# Patient Record
Sex: Female | Born: 2000 | Race: Black or African American | Hispanic: No | Marital: Single | Smoking: Never smoker
Health system: Southern US, Community
[De-identification: ages and names within clinical notes are randomized; demographics above are authoritative.]

## PROBLEM LIST (undated history)

## (undated) DIAGNOSIS — E669 Obesity, unspecified: Secondary | ICD-10-CM

## (undated) DIAGNOSIS — L709 Acne, unspecified: Secondary | ICD-10-CM

## (undated) DIAGNOSIS — J45909 Unspecified asthma, uncomplicated: Secondary | ICD-10-CM

## (undated) DIAGNOSIS — K5900 Constipation, unspecified: Secondary | ICD-10-CM

## (undated) DIAGNOSIS — T7840XA Allergy, unspecified, initial encounter: Secondary | ICD-10-CM

## (undated) DIAGNOSIS — L83 Acanthosis nigricans: Secondary | ICD-10-CM

## (undated) DIAGNOSIS — H6123 Impacted cerumen, bilateral: Secondary | ICD-10-CM

## (undated) DIAGNOSIS — Z8759 Personal history of other complications of pregnancy, childbirth and the puerperium: Secondary | ICD-10-CM

## (undated) DIAGNOSIS — D72829 Elevated white blood cell count, unspecified: Secondary | ICD-10-CM

## (undated) HISTORY — DX: Acanthosis nigricans: L83

## (undated) HISTORY — DX: Acne, unspecified: L70.9

## (undated) HISTORY — DX: Allergy, unspecified, initial encounter: T78.40XA

## (undated) HISTORY — DX: Unspecified asthma, uncomplicated: J45.909

## (undated) HISTORY — DX: Constipation, unspecified: K59.00

## (undated) HISTORY — DX: Obesity, unspecified: E66.9

## (undated) HISTORY — DX: Personal history of other complications of pregnancy, childbirth and the puerperium: Z87.59

## (undated) HISTORY — DX: Impacted cerumen, bilateral: H61.23

## (undated) HISTORY — DX: Elevated white blood cell count, unspecified: D72.829

---
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
## 2021-08-29 ENCOUNTER — Telehealth (INDEPENDENT_AMBULATORY_CARE_PROVIDER_SITE_OTHER): Payer: BC Managed Care – PPO | Admitting: Nurse Practitioner

## 2021-08-29 DIAGNOSIS — J069 Acute upper respiratory infection, unspecified: Secondary | ICD-10-CM | POA: Diagnosis not present

## 2021-08-29 DIAGNOSIS — L0291 Cutaneous abscess, unspecified: Secondary | ICD-10-CM | POA: Diagnosis not present

## 2021-08-29 MED ORDER — BENZONATATE 100 MG PO CAPS: 100.0000 mg | ORAL_CAPSULE | Freq: Three times a day (TID) | ORAL | 0 refills | Status: DC | PRN

## 2021-08-29 MED ORDER — CEPHALEXIN 500 MG PO CAPS: 500.0000 mg | ORAL_CAPSULE | Freq: Four times a day (QID) | ORAL | 0 refills | Status: AC

## 2021-08-29 NOTE — Progress Notes (Signed)
Name: Kayla Kane   MRN: [id number]    DOB: 2000-12-22   Date:08/29/2021       Progress Note  Subjective  Chief Complaint  Chief Complaint  Patient presents with   Sore Throat    X5 days   Cough    X5 days, mostly non-productive   Mass    Vaginal area, noticed it Wednesday, dysuria    I connected with  Birdie Sons  on 08/29/21 at 11:40 AM EDT by a video enabled telemedicine application and verified that I am speaking with the correct person using two identifiers.  I discussed the limitations of evaluation and management by telemedicine and the availability of in person appointments. The patient expressed understanding and agreed to proceed with a virtual visit  Staff also discussed with the patient that there may be a patient responsible charge related to this service. Patient Location: home Provider Location: [facility] Additional Individuals present: alone  HPI  URI: She says symptoms started 5 days ago.  She took a home Covid test and it was negative.  Symptoms included sore throat, dry cough and runny nose. She denies any fever, shortness of breath or chest pain.  She says she has been taking nyquil but she says her symptoms are getting better.  Discussed OTC treatments and will send in tessalon perls for cough.    Asthma: She says her asthma has been well controlled.  She said she did have to use her albuterol inhaler last night but that is it.   Abscess: She noticed a bump on her mons pubis on Wednesday.  She says it is hard and red.  She denies any drainage.  Discussed using warm compresses.  Will send in prescription of keflex.    Patient Active Problem List   Diagnosis Date Noted   Nutritional anemia, unspecified 10/01/2016   Vitamin D deficiency 10/01/2016   Acanthosis nigricans 09/02/2015   Acne vulgaris 09/02/2015   Allergic rhinitis 09/02/2015   Childhood obesity 09/02/2015   Asthma, mild intermittent, well-controlled 09/02/2015    Social History   Tobacco Use    Smoking status: Never   Smokeless tobacco: Never  Substance Use Topics   Alcohol use: No    Alcohol/week: 0.0 standard drinks     Current Outpatient Medications:    albuterol (PROAIR HFA) 108 (90 Base) MCG/ACT inhaler, Inhale 2 puffs into the lungs every 4 (four) hours as needed., Disp: 18 g, Rfl: 0  No Known Allergies  I personally reviewed active problem list, medication list, allergies with the patient/caregiver today.  ROS  Constitutional: Negative for fever or weight change.  HEENT: positive for sore throat, nasal drainage Respiratory: Positive for cough, negative for shortness of breath.   Cardiovascular: Negative for chest pain or palpitations.  Gastrointestinal: Negative for abdominal pain, no bowel changes.  Musculoskeletal: Negative for gait problem or joint swelling.  Skin: Negative for rash., positive for abscess on mons pubis Neurological: Negative for dizziness or headache.  No other specific complaints in a complete review of systems (except as listed in HPI above).   Objective  Virtual encounter, vitals not obtained.  There is no height or weight on file to calculate BMI.  Nursing Note and Vital Signs reviewed.  Physical Exam  Awake, alert and oriented, speaking in complete sentences.   No results found for this or any previous visit (from the past 72 hour(s)).  Assessment & Plan  1. Upper respiratory tract infection, unspecified type -discussed OTC treatments for  symptoms - benzonatate (TESSALON) 100 MG capsule; Take 1 capsule (100 mg total) by mouth 3 (three) times daily as needed for cough.  Dispense: 30 capsule; Refill: 0  2. Abscess on mons pubis -warm compresses to area at least 4 times a day - cephALEXin (KEFLEX) 500 MG capsule; Take 1 capsule (500 mg total) by mouth 4 (four) times daily for 5 days.  Dispense: 20 capsule; Refill: 0   -Red flags and when to present for emergency care or RTC including fever >101.26F, chest pain, shortness of  breath, new/worsening/un-resolving symptoms, reviewed with patient at time of visit. Follow up and care instructions discussed and provided in AVS. - I discussed the assessment and treatment plan with the patient. The patient was provided an opportunity to ask questions and all were answered. The patient agreed with the plan and demonstrated an understanding of the instructions.  I provided 25 minutes of non-face-to-face time during this encounter.  Bo Merino, FNP

## 2021-11-06 ENCOUNTER — Ambulatory Visit: Payer: Self-pay | Admitting: *Deleted

## 2021-11-06 NOTE — Telephone Encounter (Signed)
C/o boil to right top side of leg near vaginal area. C/o severe pain and difficulty walking. Denies fever, more than 1 boil. C/o drainage noted and bleeding. Boil noted x a few days. No available appt. Due to pain level, encouraged UC or my chart VV or ED. Care advise given. Patient verbalized understanding of care advise and to go to [facility] or ED if symptoms worsen. My chart activation code [phone] given to patient .    Reason for Disposition  SEVERE pain (e.g., excruciating)  Answer Assessment - Initial Assessment Questions 1. APPEARANCE of BOIL: "What does the boil look like?"      Color of her skin  2. LOCATION: "Where is the boil located?"      Right side of vaginal area , top of right leg  3. NUMBER: "How many boils are there?"      na 4. SIZE: "How big is the boil?" (e.g., inches, cm; compare to size of a coin or other object)     na 5. ONSET: "When did the boil start?"     Few days ago  6. PAIN: "Is there any pain?" If Yes, ask: "How bad is the pain?"   (Scale 1-10; or mild, moderate, severe)     Yes 7 at night and 6 during the day  7. FEVER: "Do you have a fever?" If Yes, ask: "What is it, how was it measured, and when did it start?"      no 8. SOURCE: "Have you been around anyone with boils or other Staph infections?" "Have you ever had boils before?"     Na  9. OTHER SYMPTOMS: "Do you have any other symptoms?" (e.g., shaking chills, weakness, rash elsewhere on body)     No  10. PREGNANCY: "Is there any chance you are pregnant?" "When was your last menstrual period?"       no  Protocols used: Boil (Skin Abscess)-A-AH

## 2021-11-06 NOTE — Telephone Encounter (Signed)
Wrong chart in error.

## 2021-11-07 NOTE — Telephone Encounter (Signed)
There are no availabilities for today. Pt advised to go to UC

## 2022-06-15 ENCOUNTER — Telehealth: Payer: BC Managed Care – PPO | Admitting: Physician Assistant

## 2022-06-15 ENCOUNTER — Ambulatory Visit: Payer: Self-pay | Admitting: *Deleted

## 2022-06-15 DIAGNOSIS — M546 Pain in thoracic spine: Secondary | ICD-10-CM

## 2022-06-15 MED ORDER — METHOCARBAMOL 500 MG PO TABS: 500.0000 mg | ORAL_TABLET | Freq: Every day | ORAL | 0 refills | Status: DC

## 2022-06-15 MED ORDER — MELOXICAM 7.5 MG PO TABS: 7.5000 mg | ORAL_TABLET | Freq: Every day | ORAL | 0 refills | Status: DC

## 2022-06-15 NOTE — Telephone Encounter (Signed)
Summary: Intense back pain   Mother of the patient called in stating her daughter the patient is have intense low back pain. This is chronic and she has spasms here and there. She had to call in last night due to the pain. The patient has been on muscle relaxer's in the past and they have helped a lot. Can she get a prescription sent in [place] on [address]), [place]  Phone: [phone]  Fax: [phone]    Please assist patient further       2nd call placed to contact patient regarding low back pain and medication request. No answer, LVMTCB [phone].

## 2022-06-15 NOTE — Telephone Encounter (Signed)
Pt returned call but hung up while call being transferred.

## 2022-06-15 NOTE — Telephone Encounter (Signed)
Message from Roslynn Amble sent at 06/15/2022  8:58 AM EDT  Summary: Intense back pain   Mother of the patient called in stating her daughter the patient is have intense low back pain. This is chronic and she has spasms here and there. She had to call in last night due to the pain. The patient has been on muscle relaxer's in the past and they have helped a lot. Can she get a prescription sent in [place] on [address]), [place]  Phone: [phone]  Fax: [phone]    Please assist patient further           Call History   Type Contact Phone/Fax User  06/15/2022 08:54 AM EDT Phone (Incoming) [place], Jakubowicz (Mother) [phone] Roslynn Amble

## 2022-06-15 NOTE — Progress Notes (Signed)
Virtual Visit Consent   Kayla Kane, you are scheduled for a virtual visit with a [facility] provider today. Just as with appointments in the office, your consent must be obtained to participate. Your consent will be active for this visit and any virtual visit you may have with one of our providers in the next 365 days. If you have a MyChart account, a copy of this consent can be sent to you electronically.  As this is a virtual visit, video technology does not allow for your provider to perform a traditional examination. This may limit your provider's ability to fully assess your condition. If your provider identifies any concerns that need to be evaluated in person or the need to arrange testing (such as labs, EKG, etc.), we will make arrangements to do so. Although advances in technology are sophisticated, we cannot ensure that it will always work on either your end or our end. If the connection with a video visit is poor, the visit may have to be switched to a telephone visit. With either a video or telephone visit, we are not always able to ensure that we have a secure connection.  By engaging in this virtual visit, you consent to the provision of healthcare and authorize for your insurance to be billed (if applicable) for the services provided during this visit. Depending on your insurance coverage, you may receive a charge related to this service.  I need to obtain your verbal consent now. Are you willing to proceed with your visit today? Kayla Kane has provided verbal consent on 06/15/2022 for a virtual visit (video or telephone). Leeanne Rio, [place]  Date: 06/15/2022 5:43 PM  Virtual Visit via Video Note   I, Leeanne Rio, PA-C, attempted to connect with Kayla Kane; MRN [id number] on 06/15/22 via [employer] to complete a video urgent care visit. The patient was unable to successfully connect to the video platform. As such, the patient was contacted by this provider via phone  to complete the encounter.   Location: Patient: Virtual Visit Location Patient: Home Provider: Virtual Visit Location Provider: Home Office   I discussed the limitations of evaluation and management by telemedicine and the availability of in person appointments. The patient expressed understanding and agreed to proceed.    History of Present Illness: Kayla Kane is a 21 y.o. who identifies as a female who was assigned female at birth, and is being seen today for R sided back pain. Notes this weekend while eating dinner had a sharp pain in middle of R side of back that was intense and then dissipated. Since the she notes that every now and then feels a minor version of this with positional changes. Some constant soreness in the area since initial episode. Notes she recently started working out more and is working a [employer] job currently having to push and pull heavy objects. Denies any trauma or injury. Denies radiation of pain. Has been applying heat and ice and taking some Tylenol OTC.    HPI: HPI  Problems:  Patient Active Problem List   Diagnosis Date Noted   Nutritional anemia, unspecified 10/01/2016   Vitamin D deficiency 10/01/2016   Acanthosis nigricans 09/02/2015   Acne vulgaris 09/02/2015   Allergic rhinitis 09/02/2015   Childhood obesity 09/02/2015   Asthma, mild intermittent, well-controlled 09/02/2015    Allergies: No Known Allergies Medications:  Current Outpatient Medications:    meloxicam (MOBIC) 7.5 MG tablet, Take 1 tablet (7.5 mg total) by  mouth daily., Disp: 15 tablet, Rfl: 0   methocarbamol (ROBAXIN) 500 MG tablet, Take 1 tablet (500 mg total) by mouth at bedtime., Disp: 10 tablet, Rfl: 0  Observations/Objective: No labored breathing. Speech is clear and coherent with logical content.  Patient is alert and oriented at baseline.   Assessment and Plan: 1. Acute right-sided thoracic back pain - meloxicam (MOBIC) 7.5 MG tablet; Take 1 tablet (7.5 mg total) by  mouth daily.  Dispense: 15 tablet; Refill: 0 - methocarbamol (ROBAXIN) 500 MG tablet; Take 1 tablet (500 mg total) by mouth at bedtime.  Dispense: 10 tablet; Refill: 0  Atraumatic. No alarm signs or symptoms present. Rest. Heating pad in 10-15 minute intervals. Start Meloxicam 7.5 mg once daily. Ok to continue Tylenol OTC. Will give few tablets of Robaxin to use in in the evening (hour or so before bedtime) if needed for muscle spasm. Precautions for medication use discussed including no driving or operating machinery while on relaxant. Work note provided.   Follow Up Instructions: I discussed the assessment and treatment plan with the patient. The patient was provided an opportunity to ask questions and all were answered. The patient agreed with the plan and demonstrated an understanding of the instructions.  A copy of instructions were sent to the patient via MyChart unless otherwise noted below.   The patient was advised to call back or seek an in-person evaluation if the symptoms worsen or if the condition fails to improve as anticipated.  Time:  I spent 10 minutes with the patient via telehealth technology discussing the above problems/concerns.    Leeanne Rio, PA-C

## 2022-06-15 NOTE — Telephone Encounter (Signed)
  Chief Complaint: Back pain Symptoms: Mid back pain, right sided mostly."Spasms" States 3/10 when occurs "Can be worse at times" Worse with movement. States has had several times past years, had muscle relaxer "Helped." New work out and lifts in [employer].  Frequency: Onset Saturday Pertinent Negatives: Patient denies urinary symptoms,fever, numbness Disposition: '[]'$ ED /'[]'$ Urgent Care (no appt availability in office) / '[]'$ Appointment(In office/virtual)/ '[x]'$  [facility]/ '[]'$ Home Care/ '[]'$ Refused Recommended Disposition /'[]'$ [facility]/ '[]'$  Follow-up with PCP Additional Notes: [facility] Virtual secured for pt. Care advise provided, verbalizes understanding.  Reason for Disposition  [1] MODERATE back pain (e.g., interferes with normal activities) AND [2] present > 3 days  Answer Assessment - Initial Assessment Questions 1. ONSET: "When did the pain begin?"      Saturday night 2. LOCATION: "Where does it hurt?" (upper, mid or lower back)     Mid on right side 3. SEVERITY: "How bad is the pain?"  (e.g., Scale 1-10; mild, moderate, or severe)   - MILD (1-3): Doesn't interfere with normal activities.    - MODERATE (4-7): Interferes with normal activities or awakens from sleep.    - SEVERE (8-10): Excruciating pain, unable to do any normal activities.      3/10 with movement 4. PATTERN: "Is the pain constant?" (e.g., yes, no; constant, intermittent)      Certain positions 5. RADIATION: "Does the pain shoot into your legs or somewhere else?"     No 6. CAUSE:  "What do you think is causing the back pain?"      Unsure 7. BACK OVERUSE:  "Any recent lifting of heavy objects, strenuous work or exercise?"     Been working out recently, lifting 8. MEDICINES: "What have you taken so far for the pain?" (e.g., nothing, acetaminophen, NSAIDS)     Tylenol, ineffective 9. NEUROLOGIC SYMPTOMS: "Do you have any weakness, numbness, or problems with bowel/bladder control?"     No 10. OTHER  SYMPTOMS: "Do you have any other symptoms?" (e.g., fever, abdomen pain, burning with urination, blood in urine)       HAd similar pain Oct-Nov 2022  Protocols used: Back Pain-A-AH

## 2022-06-15 NOTE — Telephone Encounter (Signed)
Attempted to return call.   Left voicemail to call back to discuss back pain with a nurse.

## 2022-06-15 NOTE — Patient Instructions (Addendum)
  Kayla Kane, thank you for joining Kayla Rio, PA-C for today's virtual visit.  While this provider is not your primary care provider (PCP), if your PCP is located in our provider database this encounter information will be shared with them immediately following your visit.  Consent: (Patient) Kayla Kane provided verbal consent for this virtual visit at the beginning of the encounter.  Current Medications:  Current Outpatient Medications:    albuterol (PROAIR HFA) 108 (90 Base) MCG/ACT inhaler, Inhale 2 puffs into the lungs every 4 (four) hours as needed., Disp: 18 g, Rfl: 0   benzonatate (TESSALON) 100 MG capsule, Take 1 capsule (100 mg total) by mouth 3 (three) times daily as needed for cough., Disp: 30 capsule, Rfl: 0   Medications ordered in this encounter:  No orders of the defined types were placed in this encounter.    *If you need refills on other medications prior to your next appointment, please contact your pharmacy*  Follow-Up: Call back or seek an in-person evaluation if the symptoms worsen or if the condition fails to improve as anticipated.  Other Instructions Please keep well-hydrated. Avoid heavy lifting and overexertion. Apply heating pad to area for 10-15 minutes a few times per day. Ok to continue your OTC Tylenol as needed. Take the Meloxicam once daily with food. The Methocarbamol is to be used in the evenings when home for the day to help with muscle spasm.  I have sent a work note to Doctor, hospital. If you note symptoms are not resolving, or any new/worsening symptoms, please seek an in-person evaluation.    If you have been instructed to have an in-person evaluation today at a local Urgent Care facility, please use the link below. It will take you to a list of all of our available [facility], including address, phone number and hours of operation. Please do not delay care.  [facility]  If you or a family member do not  have a primary care provider, use the link below to schedule a visit and establish care. When you choose a [facility] primary care physician or advanced practice provider, you gain a long-term partner in health. Find a Primary Care Provider  Learn more about [facility]'s in-office and virtual care options: [facility]

## 2022-06-17 ENCOUNTER — Encounter: Payer: Self-pay | Admitting: Family Medicine

## 2022-06-22 NOTE — Progress Notes (Unsigned)
60 personal or confidential phone number: [phone]  PCP: Steele Sizer, MD   History was provided by the patient.  Stacy Stewart is a 21 y.o. female who is here for Well exam .   Current concerns: STI's   Home and Environment:campus -[place] - except for Summer break Parental relations:  good  Friends/Peers: she has friend at school and at home  Nutrition/Eating Behaviors: she eats at [employer] or cooks at home, still likes junk food  Sports/Exercise:  discussed importance of increasing physical activity   Education and Employment:  School Status: in [employer] History: School attendance is regular. Work: only during the Summer at [employer]: participate on school events   Smoking: no Secondhand smoke exposure? Occasionally  Drugs/EtOH: experiments marijuana - drinks at parties - discussed date rape drug    Sexuality:  -Menarche: early teens  - females:  last menses: July 11 th 2023  - Menstrual History:  regular , lasts 5 days and no spotting in between   - Sexually active? yes - one partner - since May - uses condoms most of the time   - sexual partners in last year: less than 5 partners  - contraception use: condoms, explained not the best type of birth control at her age, discussed options, including patch, depo, nexplanon and IUD. She states had ocp in the past but used to skip doses  - Last STI Screening: done in the spring at the student center, at the time she had vulva lesions and diagnosed with herpes but culture was inconclusive and she would like to have lab test done   - Violence/Abuse: none   Mood: Suicidality and Depression: none Weapons: none    PHQ-9 completed and results indicated      06/23/2022    8:10 AM 12/08/2019    9:32 AM 10/09/2019    9:02 AM  PHQ9 SCORE ONLY  PHQ-9 Total Score 2 0 1     Physical Exam:  BP 114/70   Pulse 80   Temp 98.1 F (36.7 C) (Oral)   Resp 16   Ht 5' 2.5"  (1.588 m)   Wt 176 lb 4.8 oz (80 kg)   LMP 06/09/2022 (Exact Date)   SpO2 100%   BMI 31.73 kg/m  Blood pressure %iles are not available for patients who are 18 years or older.  General Appearance:   alert, oriented, no acute distress  HENT: Normocephalic, no obvious abnormality, PERRL, EOM's intact, conjunctiva clear  Mouth:   Normal appearing teeth, no obvious discoloration, dental caries, or dental caps  Neck:   Supple; thyroid: no enlargement, symmetric, no tenderness/mass/nodules  Lungs:   Clear to auscultation bilaterally, normal work of breathing  Heart:   Regular rate and rhythm, S1 and S2 normal, no murmurs;   Abdomen:   Soft, non-tender, no mass, or organomegaly  GU genitalia not examined  Musculoskeletal:   Tone and strength strong and symmetrical, all extremities               Lymphatic:   No cervical adenopathy  Skin/Hair/Nails:   Skin warm, dry and intact, no rashes, no bruises or petechiae  Neurologic:   Strength, gait, and coordination normal and age-appropriate    Assessment/Plan:  BMI: is not appropriate for age  Immunizations today: per orders. History of previous adverse reactions to immunizations? no Counseling completed for the following bivalent COVID  vaccine components.  - Follow-up visit in 1 years for next  well woman exam, or sooner as needed.   Loistine Chance, MD

## 2022-06-23 ENCOUNTER — Other Ambulatory Visit (HOSPITAL_COMMUNITY)
Admission: RE | Admit: 2022-06-23 | Discharge: 2022-06-23 | Disposition: A | Discharge status: Home / Self Care | Payer: BC Managed Care – PPO | Source: Ambulatory Visit | Attending: Family Medicine | Admitting: Family Medicine

## 2022-06-23 ENCOUNTER — Encounter: Payer: Self-pay | Admitting: Family Medicine

## 2022-06-23 ENCOUNTER — Ambulatory Visit (INDEPENDENT_AMBULATORY_CARE_PROVIDER_SITE_OTHER): Payer: BC Managed Care – PPO | Admitting: Family Medicine

## 2022-06-23 VITALS — BP 114/70 | HR 80 | Temp 98.1°F | Resp 16 | Ht 62.5 in | Wt 176.3 lb

## 2022-06-23 DIAGNOSIS — E559 Vitamin D deficiency, unspecified: Secondary | ICD-10-CM

## 2022-06-23 DIAGNOSIS — Z0000 Encounter for general adult medical examination without abnormal findings: Secondary | ICD-10-CM

## 2022-06-23 DIAGNOSIS — E538 Deficiency of other specified B group vitamins: Secondary | ICD-10-CM

## 2022-06-23 DIAGNOSIS — N760 Acute vaginitis: Secondary | ICD-10-CM | POA: Diagnosis not present

## 2022-06-23 DIAGNOSIS — Z113 Encounter for screening for infections with a predominantly sexual mode of transmission: Secondary | ICD-10-CM | POA: Diagnosis present

## 2022-06-23 DIAGNOSIS — Z130 Encounter for screening for diseases of the blood and blood-forming organs and certain disorders involving the immune mechanism: Secondary | ICD-10-CM

## 2022-06-23 NOTE — Patient Instructions (Signed)
Preventive Care 50-20 Years Old, Female Preventive care refers to lifestyle choices and visits with your health care provider that can promote health and wellness. At this stage in your life, you may start seeing a primary care physician instead of a pediatrician for your preventive care. Preventive care visits are also called wellness exams. What can I expect for my preventive care visit? Counseling During your preventive care visit, your health care provider may ask about your: Medical history, including: Past medical problems. Family medical history. Pregnancy history. Current health, including: Menstrual cycle. Method of birth control. Emotional well-being. Home life and relationship well-being. Sexual activity and sexual health. Lifestyle, including: Alcohol, nicotine or tobacco, and drug use. Access to firearms. Diet, exercise, and sleep habits. Sunscreen use. Motor vehicle safety. Physical exam Your health care provider may check your: Height and weight. These may be used to calculate your BMI (body mass index). BMI is a measurement that tells if you are at a healthy weight. Waist circumference. This measures the distance around your waistline. This measurement also tells if you are at a healthy weight and may help predict your risk of certain diseases, such as type 2 diabetes and high blood pressure. Heart rate and blood pressure. Body temperature. Skin for abnormal spots. Breasts. What immunizations do I need?  Vaccines are usually given at various ages, according to a schedule. Your health care provider will recommend vaccines for you based on your age, medical history, and lifestyle or other factors, such as travel or where you work. What tests do I need? Screening Your health care provider may recommend screening tests for certain conditions. This may include: Vision and hearing tests. Lipid and cholesterol levels. Pelvic exam and Pap test. Hepatitis B  test. Hepatitis C test. HIV (human immunodeficiency virus) test. STI (sexually transmitted infection) testing, if you are at risk. Tuberculosis skin test if you have symptoms. BRCA-related cancer screening. This may be done if you have a family history of breast, ovarian, tubal, or peritoneal cancers. Talk with your health care provider about your test results, treatment options, and if necessary, the need for more tests. Follow these instructions at home: Eating and drinking  Eat a healthy diet that includes fresh fruits and vegetables, whole grains, lean protein, and low-fat dairy products. Drink enough fluid to keep your urine pale yellow. Do not drink alcohol if: Your health care provider tells you not to drink. You are pregnant, may be pregnant, or are planning to become pregnant. You are under the legal drinking age. In the [place] the legal drinking age is 67. If you drink alcohol: Limit how much you have to 0-1 drink a day. Know how much alcohol is in your drink. In the [place] one drink equals one 12 oz bottle of beer (355 mL), one 5 oz glass of wine (148 mL), or one 1 oz glass of hard liquor (44 mL). Lifestyle Brush your teeth every morning and night with fluoride toothpaste. Floss one time each day. Exercise for at least 30 minutes 5 or more days of the week. Do not use any products that contain nicotine or tobacco. These products include cigarettes, chewing tobacco, and vaping devices, such as e-cigarettes. If you need help quitting, ask your health care provider. Do not use drugs. If you are sexually active, practice safe sex. Use a condom or other form of protection to prevent STIs. If you do not wish to become pregnant, use a form of birth control. If you plan to become pregnant,  see your health care provider for a prepregnancy visit. Find healthy ways to manage stress, such as: Meditation, yoga, or listening to music. Journaling. Talking to a trusted person. Spending time  with friends and family. Safety Always wear your seat belt while driving or riding in a vehicle. Do not drive: If you have been drinking alcohol. Do not ride with someone who has been drinking. When you are tired or distracted. While texting. If you have been using any mind-altering substances or drugs. Wear a helmet and other protective equipment during sports activities. If you have firearms in your house, make sure you follow all gun safety procedures. Seek help if you have been bullied, physically abused, or sexually abused. Use the internet responsibly to avoid dangers, such as online bullying and online sex predators. What's next? Go to your health care provider once a year for an annual wellness visit. Ask your health care provider how often you should have your eyes and teeth checked. Stay up to date on all vaccines. This information is not intended to replace advice given to you by your health care provider. Make sure you discuss any questions you have with your health care provider. Document Revised: 05/14/2021 Document Reviewed: 05/14/2021 Elsevier Patient Education  2023 [employer]  

## 2022-06-24 ENCOUNTER — Encounter: Payer: Self-pay | Admitting: Family Medicine

## 2022-06-24 LAB — CBC WITH DIFFERENTIAL/PLATELET
Absolute Monocytes: 713 cells/uL (ref 200–950)
Basophils Absolute: 38 cells/uL (ref 0–200)
Basophils Relative: 0.4 %
Eosinophils Absolute: 67 cells/uL (ref 15–500)
Eosinophils Relative: 0.7 %
HCT: 34.1 % — ABNORMAL LOW (ref 35.0–45.0)
Hemoglobin: 10.7 g/dL — ABNORMAL LOW (ref 11.7–15.5)
Lymphs Abs: 1929 cells/uL (ref 850–3900)
MCH: 24.4 pg — ABNORMAL LOW (ref 27.0–33.0)
MCHC: 31.4 g/dL — ABNORMAL LOW (ref 32.0–36.0)
MCV: 77.9 fL — ABNORMAL LOW (ref 80.0–100.0)
MPV: 11.3 fL (ref 7.5–12.5)
Monocytes Relative: 7.5 %
Neutro Abs: 6755 cells/uL (ref 1500–7800)
Neutrophils Relative %: 71.1 %
Platelets: 296 10*3/uL (ref 140–400)
RBC: 4.38 10*6/uL (ref 3.80–5.10)
RDW: 14.5 % (ref 11.0–15.0)
Total Lymphocyte: 20.3 %
WBC: 9.5 10*3/uL (ref 3.8–10.8)

## 2022-06-24 LAB — HSV(HERPES SIMPLEX VRS) I + II AB-IGG
HAV 1 IGG,TYPE SPECIFIC AB: 0.99 index — ABNORMAL HIGH
HSV 2 IGG,TYPE SPECIFIC AB: 14.4 index — ABNORMAL HIGH

## 2022-06-24 LAB — HEPATITIS PANEL, ACUTE
Hep A IgM: NONREACTIVE
Hep B C IgM: NONREACTIVE
Hepatitis B Surface Ag: NONREACTIVE
Hepatitis C Ab: NONREACTIVE

## 2022-06-24 LAB — CERVICOVAGINAL ANCILLARY ONLY
Bacterial Vaginitis (gardnerella): POSITIVE — AB
Candida Glabrata: NEGATIVE
Candida Vaginitis: NEGATIVE
Chlamydia: NEGATIVE
Comment: NEGATIVE
Comment: NEGATIVE
Comment: NEGATIVE
Comment: NEGATIVE
Comment: NEGATIVE
Comment: NORMAL
Neisseria Gonorrhea: NEGATIVE
Trichomonas: NEGATIVE

## 2022-06-24 LAB — RPR: RPR Ser Ql: NONREACTIVE

## 2022-06-24 LAB — ADVANCED WRITTEN NOTIFICATION (AWN) TEST REFUSAL: AWN TEST REFUSED: 760016802

## 2022-06-24 LAB — B12 AND FOLATE PANEL
Folate: 3.8 ng/mL — ABNORMAL LOW
Vitamin B-12: 221 pg/mL (ref 200–1100)

## 2022-06-24 LAB — VITAMIN D 25 HYDROXY (VIT D DEFICIENCY, FRACTURES): Vit D, 25-Hydroxy: 18 ng/mL — ABNORMAL LOW (ref 30–100)

## 2022-06-24 LAB — HIV ANTIBODY (ROUTINE TESTING W REFLEX): HIV 1&2 Ab, 4th Generation: NONREACTIVE

## 2022-06-25 ENCOUNTER — Ambulatory Visit: Payer: Self-pay | Admitting: *Deleted

## 2022-06-25 NOTE — Telephone Encounter (Signed)
Reason for Disposition . [1] Follow-up call to recent contact AND [2] information only call, no triage required  Answer Assessment - Initial Assessment Questions 1. REASON FOR CALL or QUESTION: "What is your reason for calling today?" or "How can I best help you?" or "What question do you have that I can help answer?"     Lab result message from Dr. Ancil Boozer read to pt dated 06/25/2022 at 11:31 AM.   She also saw the results on MyChart.  She is willing to take the Valtrex 500 mg.   The rx needs to be called in.  She will call back to schedule her 3 month check due to her schedule.  Protocols used: Information Only Call - No Triage-A-AH

## 2022-06-26 ENCOUNTER — Other Ambulatory Visit: Payer: Self-pay | Admitting: Family Medicine

## 2022-06-26 DIAGNOSIS — A6009 Herpesviral infection of other urogenital tract: Secondary | ICD-10-CM

## 2022-06-26 MED ORDER — VALACYCLOVIR HCL 500 MG PO TABS: 500.0000 mg | ORAL_TABLET | Freq: Every day | ORAL | 1 refills | Status: DC

## 2022-06-29 NOTE — Telephone Encounter (Signed)
Pt will call back to schedule a 12mh appt once she checks her schedule

## 2022-12-02 NOTE — Progress Notes (Unsigned)
Name: Stacy Stewart   MRN: [id number]    DOB: [birth year]   Date:12/03/2022       Progress Note  Subjective  Chief Complaint  Vaginal Exam  HPI  Vaginal discharge: she has been with the same sexual partner for months now, last intercourse about one month ago. They have been checked STI during their relationship and it was negative for STI's. She had BV this past Summer. Discharge started a few weeks ago, mild odor , white in color, no pruritus. Underwear is getting wet by the end of her work shift.   Contraception: she is sexually active, only uses condoms occasionally, she was forgetting to take ocp's , discussed a patch versus Depo shot , she is not sure. She said she will try ocp pills again .   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  Family History  Problem Relation Age of Onset   Obesity Mother    Asthma Father    Asthma Sister     Social History   Tobacco Use   Smoking status: Never   Smokeless tobacco: Never  Substance Use Topics   Alcohol use: No    Alcohol/week: 0.0 standard drinks of alcohol     Current Outpatient Medications:    valACYclovir (VALTREX) 500 MG tablet, Take 1 tablet (500 mg total) by mouth daily. But three times daily during outbreaks, Disp: 100 tablet, Rfl: 1   Cyanocobalamin (B-12) 1000 MCG SUBL, Place 1 tablet under the tongue daily. (Patient not taking: Reported on 06/23/2022), Disp: 30 tablet, Rfl: 5   Pediatric Multivitamins-Iron (MULTIPLE VITAMINS-IRON) 15 MG CHEW, Chew 4 tablets (60 mg total) by mouth daily. (Patient not taking: Reported on 06/23/2022), Disp: 100 tablet, Rfl: 1   Vitamin D, Ergocalciferol, (DRISDOL) 1.25 MG (50000 UT) CAPS capsule,  Take 1 capsule (50,000 Units total) by mouth every 7 (seven) days. (Patient not taking: Reported on 06/23/2022), Disp: 12 capsule, Rfl: 1  No Known Allergies  I personally reviewed active problem list, medication list, allergies, family history, social history, health maintenance with the patient/caregiver today.   ROS  Ten systems reviewed and is negative except as mentioned in HPI    Objective  Vitals:   12/03/22 0820  BP: 116/72  Pulse: 95  Resp: 16  SpO2: 100%  Weight: 180 lb (81.6 kg)  Height: '5\' 3"'$  (1.6 m)    Body mass index is 31.89 kg/m.  Physical Exam  Constitutional: Patient appears well-developed and well-nourished. Obese  No distress.  HEENT: head atraumatic, normocephalic, pupils equal and reactive to light, neck supple Cardiovascular: Normal rate, regular rhythm and normal heart sounds.  No murmur heard. No BLE edema. Pulmonary/Chest: Effort normal and breath sounds normal. No respiratory distress. Abdominal: Soft.  There is no tenderness. Psychiatric: Patient has a normal mood and affect. behavior is normal. Judgment and thought content normal.   PHQ2/9:    12/03/2022    8:19 AM 06/23/2022    8:10 AM 12/08/2019    9:32 AM 10/09/2019    9:02 AM 10/07/2018    8:50 AM  Depression screen PHQ 2/9  Decreased Interest 0 1 0 1 0  Down, Depressed, Hopeless 0 1 0 0 0  PHQ -  2 Score 0 2 0 1 0  Altered sleeping 0 0 0 0 0  Tired, decreased energy 0 0 0 0 0  Change in appetite 0 0 0 0 0  Feeling bad or failure about yourself  0 0 0 0 0  Trouble concentrating 0 0 0 0 0  Moving slowly or fidgety/restless 0 0 0 0 0  Suicidal thoughts 0 0 0 0 0  PHQ-9 Score 0 2 0 1 0  Difficult doing work/chores  Not difficult at all  Not difficult at all Not difficult at all    phq 9 is negative   Fall Risk:    12/03/2022    8:19 AM 06/23/2022    8:09 AM 10/09/2019    9:02 AM 10/07/2018    8:50 AM 04/09/2016   10:32 AM  Fall Risk   Falls in the past year? 0 0 0 0 No  Number falls  in past yr: 0 0 0    Injury with Fall? 0 0 0    Risk for fall due to : No Fall Risks No Fall Risks     Follow up Falls prevention discussed Falls prevention discussed;Education provided         Functional Status Survey: Is the patient deaf or have difficulty hearing?: No Does the patient have difficulty seeing, even when wearing glasses/contacts?: No Does the patient have difficulty concentrating, remembering, or making decisions?: No Does the patient have difficulty walking or climbing stairs?: No Does the patient have difficulty dressing or bathing?: No Does the patient have difficulty doing errands alone such as visiting a doctor's office or shopping?: No    Assessment & Plan  1. Vaginal discharge  - Cervicovaginal ancillary only  2. Vitamin D deficiency  - Vitamin D, Ergocalciferol, (DRISDOL) 1.25 MG (50000 UNIT) CAPS capsule; Take 1 capsule (50,000 Units total) by mouth every 7 (seven) days.  Dispense: 12 capsule; Refill: 1  3. B12 deficiency  - Cyanocobalamin (B-12) 1000 MCG SUBL; Place 1 tablet under the tongue daily.  Dispense: 30 tablet; Refill: 5 - cyanocobalamin (VITAMIN B12) injection 1,000 mcg  4. Advised about oral contraception  - norgestimate-ethinyl estradiol ([place] 28) 0.25-35 MG-MCG tablet; Take 1 tablet by mouth daily.  Dispense: 84 tablet; Refill: 3

## 2022-12-03 ENCOUNTER — Ambulatory Visit: Payer: BC Managed Care – PPO | Admitting: Family Medicine

## 2022-12-03 ENCOUNTER — Encounter: Payer: Self-pay | Admitting: Family Medicine

## 2022-12-03 ENCOUNTER — Other Ambulatory Visit (HOSPITAL_COMMUNITY)
Admission: RE | Admit: 2022-12-03 | Discharge: 2022-12-03 | Disposition: A | Discharge status: Home / Self Care | Payer: BC Managed Care – PPO | Source: Ambulatory Visit | Attending: Family Medicine | Admitting: Family Medicine

## 2022-12-03 VITALS — BP 116/72 | HR 95 | Resp 16 | Ht 63.0 in | Wt 180.0 lb

## 2022-12-03 DIAGNOSIS — E559 Vitamin D deficiency, unspecified: Secondary | ICD-10-CM | POA: Diagnosis not present

## 2022-12-03 DIAGNOSIS — N898 Other specified noninflammatory disorders of vagina: Secondary | ICD-10-CM | POA: Insufficient documentation

## 2022-12-03 DIAGNOSIS — E538 Deficiency of other specified B group vitamins: Secondary | ICD-10-CM | POA: Diagnosis not present

## 2022-12-03 DIAGNOSIS — Z3009 Encounter for other general counseling and advice on contraception: Secondary | ICD-10-CM | POA: Diagnosis not present

## 2022-12-03 DIAGNOSIS — B9689 Other specified bacterial agents as the cause of diseases classified elsewhere: Secondary | ICD-10-CM | POA: Insufficient documentation

## 2022-12-03 DIAGNOSIS — N760 Acute vaginitis: Secondary | ICD-10-CM | POA: Insufficient documentation

## 2022-12-03 MED ORDER — CYANOCOBALAMIN 1000 MCG/ML IJ SOLN: 1000.0000 ug | Freq: Once | INTRAMUSCULAR | Status: DC

## 2022-12-03 MED ORDER — NORGESTIMATE-ETH ESTRADIOL 0.25-35 MG-MCG PO TABS: 1.0000 | ORAL_TABLET | Freq: Every day | ORAL | 3 refills | Status: DC

## 2022-12-03 MED ORDER — B-12 1000 MCG SL SUBL: 1.0000 | SUBLINGUAL_TABLET | Freq: Every day | SUBLINGUAL | 5 refills | Status: AC

## 2022-12-03 MED ORDER — CYANOCOBALAMIN 1000 MCG/ML IJ SOLN
1000.0000 ug | Freq: Once | INTRAMUSCULAR | Status: AC
  Administered 2022-12-03: 1000 ug via INTRAMUSCULAR

## 2022-12-03 MED ORDER — VITAMIN D (ERGOCALCIFEROL) 1.25 MG (50000 UNIT) PO CAPS: 50000.0000 [IU] | ORAL_CAPSULE | ORAL | 1 refills | Status: DC

## 2022-12-03 NOTE — Addendum Note (Signed)
Addended by: Royal Hawthorn on: 12/03/2022 10:30 AM   Modules accepted: Orders

## 2022-12-04 ENCOUNTER — Other Ambulatory Visit: Payer: Self-pay | Admitting: Family Medicine

## 2022-12-04 DIAGNOSIS — N760 Acute vaginitis: Secondary | ICD-10-CM

## 2022-12-04 LAB — CERVICOVAGINAL ANCILLARY ONLY
Bacterial Vaginitis (gardnerella): POSITIVE — AB
Candida Glabrata: NEGATIVE
Candida Vaginitis: NEGATIVE
Comment: NEGATIVE
Comment: NEGATIVE
Comment: NEGATIVE

## 2022-12-04 MED ORDER — METRONIDAZOLE 0.75 % EX GEL: 1.0000 | Freq: Two times a day (BID) | CUTANEOUS | 0 refills | Status: DC

## 2022-12-06 ENCOUNTER — Encounter: Payer: Self-pay | Admitting: Family Medicine

## 2022-12-07 ENCOUNTER — Other Ambulatory Visit: Payer: Self-pay | Admitting: Family Medicine

## 2022-12-07 DIAGNOSIS — N898 Other specified noninflammatory disorders of vagina: Secondary | ICD-10-CM

## 2022-12-07 DIAGNOSIS — B9689 Other specified bacterial agents as the cause of diseases classified elsewhere: Secondary | ICD-10-CM

## 2022-12-07 MED ORDER — METRONIDAZOLE 0.75 % VA GEL: 1.0000 | Freq: Two times a day (BID) | VAGINAL | 0 refills | Status: DC

## 2023-05-26 ENCOUNTER — Telehealth: Payer: Self-pay

## 2023-05-26 NOTE — Telephone Encounter (Signed)
LVM for patient to call back [phone], or to call PCP office to schedule follow up apt. AS, CMA  

## 2023-05-31 ENCOUNTER — Ambulatory Visit: Payer: BC Managed Care – PPO | Admitting: Nurse Practitioner

## 2023-05-31 ENCOUNTER — Other Ambulatory Visit (HOSPITAL_COMMUNITY)
Admission: RE | Admit: 2023-05-31 | Discharge: 2023-05-31 | Disposition: A | Discharge status: Home / Self Care | Payer: BC Managed Care – PPO | Source: Ambulatory Visit | Attending: Nurse Practitioner | Admitting: Nurse Practitioner

## 2023-05-31 VITALS — BP 114/72 | HR 78 | Temp 97.7°F | Resp 14 | Ht 63.0 in | Wt 171.1 lb

## 2023-05-31 DIAGNOSIS — Z114 Encounter for screening for human immunodeficiency virus [HIV]: Secondary | ICD-10-CM | POA: Diagnosis not present

## 2023-05-31 DIAGNOSIS — N898 Other specified noninflammatory disorders of vagina: Secondary | ICD-10-CM

## 2023-05-31 DIAGNOSIS — Z113 Encounter for screening for infections with a predominantly sexual mode of transmission: Secondary | ICD-10-CM | POA: Diagnosis not present

## 2023-05-31 NOTE — Progress Notes (Signed)
BP 114/72 (BP Location: Right Arm, Patient Position: Sitting, Cuff Size: Normal)   Pulse 78   Temp 97.7 F (36.5 C) (Oral)   Resp 14   Ht 5\' 3"  (1.6 m) Comment: per patient  Wt 171 lb 1.6 oz (77.6 kg)   LMP 05/20/2023 (Exact Date) Comment: 06/16-20/2024 per patient  SpO2 99%   BMI 30.31 kg/m    Subjective:    Patient ID: Stacy Stewart, female    DOB: [birth year], 22 y.o.   MRN: [id number]  HPI: Stacy Stewart is a 22 y.o. female  Chief Complaint  Patient presents with   Vaginal Discharge   Vaginal discharge: she noticed a discharge started last week. She reports it is a clear/white discharge.   She denies any pain, she says she noticed some vaginal itching. She says denies any dysuria, urinary frequency or urgency.  She is sexually active.  She says she was last sexually active was back in May.  She says she did use condoms.  Will get vaginal swab and sti testing.  ( She is not good with pills).   Relevant past medical, surgical, family and social history reviewed and updated as indicated. Interim medical history since our last visit reviewed. Allergies and medications reviewed and updated.  Review of Systems  Constitutional: Negative for fever or weight change.  Respiratory: Negative for cough and shortness of breath.   Cardiovascular: Negative for chest pain or palpitations.  Gastrointestinal: Negative for abdominal pain, no bowel changes.  Musculoskeletal: Negative for gait problem or joint swelling.  Skin: Negative for rash.  Neurological: Negative for dizziness or headache.  No other specific complaints in a complete review of systems (except as listed in HPI above).      Objective:    BP 114/72 (BP Location: Right Arm, Patient Position: Sitting, Cuff Size: Normal)   Pulse 78   Temp 97.7 F (36.5 C) (Oral)   Resp 14   Ht 5\' 3"  (1.6 m) Comment: per patient  Wt 171 lb 1.6 oz (77.6 kg)   LMP 05/20/2023 (Exact Date) Comment: 06/16-20/2024 per patient  SpO2 99%   BMI  30.31 kg/m   Wt Readings from Last 3 Encounters:  05/31/23 171 lb 1.6 oz (77.6 kg)  12/03/22 180 lb (81.6 kg)  06/23/22 176 lb 4.8 oz (80 kg) (94 %, Z= 1.52)*   * Growth percentiles are based on CDC (Girls, 2-20 Years) data.    Physical Exam  Constitutional: Patient appears well-developed and well-nourished.  No distress.  HEENT: head atraumatic, normocephalic, pupils equal and reactive to light, neck supple, Cardiovascular: Normal rate, regular rhythm and normal heart sounds.  No murmur heard. No BLE edema. Pulmonary/Chest: Effort normal and breath sounds normal. No respiratory distress. Abdominal: Soft.  There is no tenderness. Psychiatric: Patient has a normal mood and affect. behavior is normal. Judgment and thought content normal.      Assessment & Plan:   Problem List Items Addressed This Visit   None Visit Diagnoses     Vaginal discharge    -  Primary   vaginal swab collected   Relevant Orders   Cervicovaginal ancillary only   Screening for HIV without presence of risk factors       labs ordered   Relevant Orders   HIV Antibody (routine testing w rflx)   Routine screening for STI (sexually transmitted infection)       labs ordered   Relevant Orders   RPR   Hepatitis C antibody  HIV Antibody (routine testing w rflx)        Follow up plan: Return if symptoms worsen or fail to improve.

## 2023-06-01 LAB — HEPATITIS C ANTIBODY: Hepatitis C Ab: NONREACTIVE

## 2023-06-01 LAB — HIV ANTIBODY (ROUTINE TESTING W REFLEX): HIV 1&2 Ab, 4th Generation: NONREACTIVE

## 2023-06-01 LAB — RPR: RPR Ser Ql: NONREACTIVE

## 2023-06-02 LAB — CERVICOVAGINAL ANCILLARY ONLY
Bacterial Vaginitis (gardnerella): NEGATIVE
Candida Glabrata: NEGATIVE
Candida Vaginitis: NEGATIVE
Chlamydia: NEGATIVE
Comment: NEGATIVE
Comment: NEGATIVE
Comment: NEGATIVE
Comment: NEGATIVE
Comment: NEGATIVE
Comment: NORMAL
Neisseria Gonorrhea: NEGATIVE
Trichomonas: POSITIVE — AB

## 2023-06-03 ENCOUNTER — Encounter: Payer: Self-pay | Admitting: Nurse Practitioner

## 2023-06-04 ENCOUNTER — Other Ambulatory Visit: Payer: Self-pay | Admitting: Nurse Practitioner

## 2023-06-04 DIAGNOSIS — N760 Acute vaginitis: Secondary | ICD-10-CM

## 2023-06-04 MED ORDER — METRONIDAZOLE 0.75 % VA GEL: 1.0000 | Freq: Two times a day (BID) | VAGINAL | 0 refills | Status: DC

## 2023-07-01 ENCOUNTER — Encounter: Payer: BC Managed Care – PPO | Admitting: Family Medicine

## 2023-07-09 NOTE — Progress Notes (Signed)
Name: Stacy Stewart   MRN: [id number]    DOB: [birth year]   Date:07/09/2023       Progress Note  Subjective  Chief Complaint  Annual Exam  HPI  Patient presents for annual CPE.  Diet: *** Exercise: ***  Last Eye Exam: *** Last Dental Exam: ***  Flowsheet Row Office Visit from 05/31/2023 in [facility]  AUDIT-C Score 3      Depression: Phq 9 is  {Desc; negative/positive:13464}    05/31/2023    3:02 PM 12/03/2022    8:19 AM 06/23/2022    8:10 AM 12/08/2019    9:32 AM 10/09/2019    9:02 AM  Depression screen PHQ 2/9  Decreased Interest 1 0 1 0 1  Down, Depressed, Hopeless 0 0 1 0 0  PHQ - 2 Score 1 0 2 0 1  Altered sleeping 0 0 0 0 0  Tired, decreased energy 0 0 0 0 0  Change in appetite 0 0 0 0 0  Feeling bad or failure about yourself  0 0 0 0 0  Trouble concentrating 0 0 0 0 0  Moving slowly or fidgety/restless 0 0 0 0 0  Suicidal thoughts 0 0 0 0 0  PHQ-9 Score 1 0 2 0 1  Difficult doing work/chores Not difficult at all  Not difficult at all  Not difficult at all   Hypertension: BP Readings from Last 3 Encounters:  05/31/23 114/72  12/03/22 116/72  06/23/22 114/70   Obesity: Wt Readings from Last 3 Encounters:  05/31/23 171 lb 1.6 oz (77.6 kg)  12/03/22 180 lb (81.6 kg)  06/23/22 176 lb 4.8 oz (80 kg) (94%, Z= 1.52)*   * Growth percentiles are based on CDC (Girls, 2-20 Years) data.   BMI Readings from Last 3 Encounters:  05/31/23 30.31 kg/m  12/03/22 31.89 kg/m  06/23/22 31.73 kg/m (95%, Z= 1.65)*   * Growth percentiles are based on CDC (Girls, 2-20 Years) data.     Vaccines:   HPV: up to date Tdap: up to date Shingrix: N/A Pneumonia: up to date Flu: up to date COVID-19: up to date   Hep C Screening: 05/31/23 STD testing and prevention (HIV/chl/gon/syphilis): 05/31/23 Intimate partner violence: negative screen  Sexual History : Menstrual History/LMP/Abnormal Bleeding:  Discussed importance of follow up if any post-menopausal  bleeding: N/A  Incontinence Symptoms: negative for symptoms   Breast cancer:  - Last Mammogram: N/A - BRCA gene screening: N/A  Osteoporosis Prevention : Discussed high calcium and vitamin D supplementation, weight bearing exercises Bone density: N/A   Cervical cancer screening: Chlamydia screening 05/31/23  Skin cancer: Discussed monitoring for atypical lesions  Colorectal cancer: N/A   Lung cancer:  Low Dose CT Chest recommended if Age 37-80 years, 20 pack-year currently smoking OR have quit w/in 15years. Patient does not qualify for screen   ECG: N/A  Advanced Care Planning: A voluntary discussion about advance care planning including the explanation and discussion of advance directives.  Discussed health care proxy and Living will, and the patient was able to identify a health care proxy as ***.  Patient does not have a living will and power of attorney of health care   Lipids: Lab Results  Component Value Date   CHOL 129 09/30/2016   CHOL 117 08/21/2014   Lab Results  Component Value Date   HDL 45 09/30/2016   HDL 40 08/21/2014   Lab Results  Component Value Date   LDLCALC 74 09/30/2016  LDLCALC 66 08/21/2014   Lab Results  Component Value Date   TRIG 52 09/30/2016   TRIG 55 08/21/2014   Lab Results  Component Value Date   CHOLHDL 2.9 09/30/2016   No results found for: "LDLDIRECT"  Glucose: Glucose, Bld  Date Value Ref Range Status  10/07/2018 73 65 - 99 mg/dL Final    Comment:    .            Fasting reference interval .   10/06/2017 88 65 - 139 mg/dL Final    Comment:    .        Non-fasting reference interval .   09/30/2016 89 65 - 99 mg/dL Final    Patient Active Problem List   Diagnosis Date Noted   Axillary hidradenitis suppurativa 10/07/2018   Vitamin D deficiency 10/01/2016   B12 deficiency 10/01/2016   Acanthosis nigricans 09/02/2015   Allergic rhinitis 09/02/2015    No past surgical history on file.  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  Tobacco Use   Smoking status: Never   Smokeless tobacco: Never  Vaping Use   Vaping status: Never Used  Substance and Sexual Activity   Alcohol use: No    Alcohol/week: 0.0 standard drinks of alcohol   Drug use: No   Sexual activity: Yes    Birth control/protection: None  Other Topics Concern   Not on file  Social History Narrative   Lives with twin sister and mother   Social Determinants of Health   Financial Resource Strain: Low Risk  (05/31/2023)   Overall Financial Resource Strain (CARDIA)    Difficulty of Paying Living Expenses: Not hard at all  Food Insecurity: No Food Insecurity (05/31/2023)   Hunger Vital Sign    Worried About Running Out of Food in the Last Year: Never true    Ran Out of Food in the Last Year: Never true  Transportation Needs: No Transportation Needs (05/31/2023)   PRAPARE - Administrator, Civil Service (Medical): No    Lack of Transportation (Non-Medical): No  Physical Activity: Sufficiently Active (05/31/2023)   Exercise Vital Sign    Days of Exercise per Week: 6 days    Minutes of Exercise per Session: 40 min  Stress: No Stress Concern Present (05/31/2023)   [employer] of Occupational Health - Occupational Stress Questionnaire    Feeling of Stress : Only a little  Social Connections: Moderately Integrated (05/31/2023)   Social Connection and Isolation Panel [NHANES]    Frequency of Communication with Friends and Family: More than three times a week    Frequency of Social Gatherings with Friends and Family: Three times a week    Attends Religious Services: More than 4  times per year    Active Member of Clubs or Organizations: Yes    Attends Banker Meetings: 1 to 4 times per year    Marital Status: Never married  Intimate Partner Violence: Not At Risk (06/23/2022)   Humiliation, Afraid, Rape, and Kick questionnaire    Fear  of Current or Ex-Partner: No    Emotionally Abused: No    Physically Abused: No    Sexually Abused: No     Current Outpatient Medications:    Cyanocobalamin (B-12) 1000 MCG SUBL, Place 1 tablet under the tongue daily., Disp: 30 tablet, Rfl: 5   metroNIDAZOLE (METROGEL) 0.75 % gel, Apply 1 Application topically 2 (two) times daily. (Patient not taking: Reported on 05/31/2023), Disp: 45 g, Rfl: 0   metroNIDAZOLE (METROGEL) 0.75 % vaginal gel, Place 1 Applicatorful vaginally 2 (two) times daily., Disp: 70 g, Rfl: 0   norgestimate-ethinyl estradiol (SPRINTEC 28) 0.25-35 MG-MCG tablet, Take 1 tablet by mouth daily., Disp: 84 tablet, Rfl: 3   Pediatric Multivitamins-Iron (MULTIPLE VITAMINS-IRON) 15 MG CHEW, Chew 4 tablets (60 mg total) by mouth daily. (Patient not taking: Reported on 06/23/2022), Disp: 100 tablet, Rfl: 1   valACYclovir (VALTREX) 500 MG tablet, Take 1 tablet (500 mg total) by mouth daily. But three times daily during outbreaks, Disp: 100 tablet, Rfl: 1   Vitamin D, Ergocalciferol, (DRISDOL) 1.25 MG (50000 UNIT) CAPS capsule, Take 1 capsule (50,000 Units total) by mouth every 7 (seven) days., Disp: 12 capsule, Rfl: 1  No Known Allergies   ROS  ***  Objective  There were no vitals filed for this visit.  There is no height or weight on file to calculate BMI.  Physical Exam ***  Recent Results (from the past 2160 hour(s))  Cervicovaginal ancillary only     Status: Abnormal   Collection Time: 05/31/23  3:04 PM  Result Value Ref Range   Neisseria Gonorrhea Negative    Chlamydia Negative    Trichomonas Positive (A)    Bacterial Vaginitis (gardnerella) Negative    Candida Vaginitis Negative    Candida  Glabrata Negative    Comment      Normal Reference Range Bacterial Vaginosis - Negative   Comment Normal Reference Range Candida Species - Negative    Comment Normal Reference Range Candida Galbrata - Negative    Comment Normal Reference Range Trichomonas - Negative    Comment Normal Reference Ranger Chlamydia - Negative    Comment      Normal Reference Range Neisseria Gonorrhea - Negative  RPR     Status: None   Collection Time: 05/31/23  3:18 PM  Result Value Ref Range   RPR Ser Ql NON-REACTIVE NON-REACTIVE    Comment: . No laboratory evidence of syphilis. If recent exposure is suspected, submit a new sample in 2-4 weeks. .   Hepatitis C antibody     Status: None   Collection Time: 05/31/23  3:18 PM  Result Value Ref Range   Hepatitis C Ab NON-REACTIVE NON-REACTIVE    Comment: . HCV antibody was non-reactive. There is no laboratory  evidence of HCV infection. . In most cases, no further action is required. However, if recent HCV exposure is suspected, a test for HCV RNA (test code 95638) is suggested. . For additional information please refer to http://education.[facility].com/faq/FAQ22v1 (This link is being provided for informational/ educational purposes only.) .   HIV Antibody (routine testing w rflx)     Status: None   Collection Time: 05/31/23  3:18 PM  Result Value Ref Range   HIV 1&2 Ab, 4th Generation NON-REACTIVE NON-REACTIVE    Comment: HIV-1 antigen and HIV-1/HIV-2 antibodies were not detected. There is no laboratory evidence of HIV infection. Marland Kitchen PLEASE NOTE: This information has been disclosed to you from records whose confidentiality may be protected by state law.  If your state requires such protection, then the state law prohibits you from making any further disclosure of the information without the specific written consent of the person to whom it pertains, or as otherwise permitted by law. A general authorization for the release of medical  or other information is NOT sufficient for this purpose. . For additional information please refer to http://education.[facility].com/faq/FAQ106 (This link is being provided for informational/ educational purposes only.) . Marland Kitchen The performance of this assay has not been clinically validated in patients less than 30 years old. .      Fall Risk:    05/31/2023    3:02 PM 12/03/2022    8:19 AM 06/23/2022    8:09 AM 10/09/2019    9:02 AM 10/07/2018    8:50 AM  Fall Risk   Falls in the past year? 0 0 0 0 0  Number falls in past yr:  0 0 0   Injury with Fall?  0 0 0   Risk for fall due to : No Fall Risks No Fall Risks No Fall Risks    Follow up Falls prevention discussed Falls prevention discussed Falls prevention discussed;Education provided       Functional Status Survey:     Assessment & Plan  1. Well adult exam ***   -USPSTF grade A and B recommendations reviewed with patient; age-appropriate recommendations, preventive care, screening tests, etc discussed and encouraged; healthy living encouraged; see AVS for patient education given to patient -Discussed importance of 150 minutes of physical activity weekly, eat two servings of fish weekly, eat one serving of tree nuts ( cashews, pistachios, pecans, almonds.Marland Kitchen) every other day, eat 6 servings of fruit/vegetables daily and drink plenty of water and avoid sweet beverages.   -Reviewed Health Maintenance: Yes.

## 2023-07-12 ENCOUNTER — Encounter: Payer: Self-pay | Admitting: Family Medicine

## 2023-07-12 ENCOUNTER — Ambulatory Visit (INDEPENDENT_AMBULATORY_CARE_PROVIDER_SITE_OTHER): Payer: BC Managed Care – PPO | Admitting: Family Medicine

## 2023-07-12 ENCOUNTER — Other Ambulatory Visit (HOSPITAL_COMMUNITY)
Admission: RE | Admit: 2023-07-12 | Discharge: 2023-07-12 | Disposition: A | Discharge status: Home / Self Care | Payer: BC Managed Care – PPO | Source: Ambulatory Visit | Attending: Family Medicine | Admitting: Family Medicine

## 2023-07-12 VITALS — BP 118/68 | HR 85 | Resp 16 | Ht 63.0 in | Wt 166.0 lb

## 2023-07-12 DIAGNOSIS — E538 Deficiency of other specified B group vitamins: Secondary | ICD-10-CM

## 2023-07-12 DIAGNOSIS — E559 Vitamin D deficiency, unspecified: Secondary | ICD-10-CM

## 2023-07-12 DIAGNOSIS — Z113 Encounter for screening for infections with a predominantly sexual mode of transmission: Secondary | ICD-10-CM

## 2023-07-12 DIAGNOSIS — Z0000 Encounter for general adult medical examination without abnormal findings: Secondary | ICD-10-CM | POA: Insufficient documentation

## 2023-07-12 DIAGNOSIS — D509 Iron deficiency anemia, unspecified: Secondary | ICD-10-CM

## 2023-07-12 DIAGNOSIS — Z833 Family history of diabetes mellitus: Secondary | ICD-10-CM

## 2023-07-12 DIAGNOSIS — Z13220 Encounter for screening for lipoid disorders: Secondary | ICD-10-CM

## 2023-07-12 DIAGNOSIS — N898 Other specified noninflammatory disorders of vagina: Secondary | ICD-10-CM | POA: Diagnosis not present

## 2023-07-12 MED ORDER — VITAMIN D 50 MCG (2000 UT) PO CAPS
1.0000 | ORAL_CAPSULE | Freq: Every day | ORAL | Status: AC
Start: 2023-07-12 — End: ?

## 2023-11-18 ENCOUNTER — Ambulatory Visit: Payer: Self-pay

## 2023-11-18 NOTE — Telephone Encounter (Signed)
  Chief Complaint: constant pressure to sternal area of chest 5/10. Symptoms: hurts when moves head also feels in shoulder Frequency: since this am  Pertinent Negatives: Patient denies SOB, sweating, nausea, dizziness, hurting with deep breath Disposition: [x] ED /[] Urgent Care (no appt availability in office) / [] Appointment(In office/virtual)/ []  [facility]/ [] Home Care/ [] Refused Recommended Disposition /[] [facility]/ []  Follow-up with PCP Additional Notes: advised pt to call 911.  Reason for Disposition . [1] Chest pain lasts > 5 minutes AND [2] described as crushing, pressure-like, or heavy    "Pressure"  Answer Assessment - Initial Assessment Questions 1. LOCATION: "Where does it hurt?"       Sternum area 2. RADIATION: "Does the pain go anywhere else?" (e.g., into neck, jaw, arms, back)     no 3. ONSET: "When did the chest pain begin?" (Minutes, hours or days)      This am  4. PATTERN: "Does the pain come and go, or has it been constant since it started?"  "Does it get worse with exertion?"      Constant - yes  5. DURATION: "How long does it last" (e.g., seconds, minutes, hours)     Constant  6. SEVERITY: "How bad is the pain?"  (e.g., Scale 1-10; mild, moderate, or severe)    - MILD (1-3): doesn't interfere with normal activities     - MODERATE (4-7): interferes with normal activities or awakens from sleep    - SEVERE (8-10): excruciating pain, unable to do any normal activities       Moderate "pressure" 7. CARDIAC RISK FACTORS: "Do you have any history of heart problems or risk factors for heart disease?" (e.g., angina, prior heart attack; diabetes, high blood pressure, high cholesterol, smoker, or strong family history of heart disease)     no  10. OTHER SYMPTOMS: "Do you have any other symptoms?" (e.g., dizziness, nausea, vomiting, sweating, fever, difficulty breathing, cough)       no  Protocols used: Chest Pain-A-AH

## 2023-11-19 NOTE — Telephone Encounter (Signed)
No answer from pt. LVMTCB.

## 2023-11-19 NOTE — Telephone Encounter (Signed)
FYI

## 2023-12-03 ENCOUNTER — Other Ambulatory Visit (HOSPITAL_COMMUNITY)
Admission: RE | Admit: 2023-12-03 | Discharge: 2023-12-03 | Disposition: A | Discharge status: Home / Self Care | Payer: 59 | Source: Ambulatory Visit | Attending: Family Medicine | Admitting: Family Medicine

## 2023-12-03 ENCOUNTER — Encounter: Payer: Self-pay | Admitting: Family Medicine

## 2023-12-03 ENCOUNTER — Ambulatory Visit: Payer: 59 | Admitting: Family Medicine

## 2023-12-03 VITALS — BP 122/76 | HR 84 | Temp 98.2°F | Resp 16 | Ht 63.0 in | Wt 171.8 lb

## 2023-12-03 DIAGNOSIS — N898 Other specified noninflammatory disorders of vagina: Secondary | ICD-10-CM | POA: Diagnosis present

## 2023-12-03 DIAGNOSIS — A6009 Herpesviral infection of other urogenital tract: Secondary | ICD-10-CM | POA: Diagnosis not present

## 2023-12-03 DIAGNOSIS — E559 Vitamin D deficiency, unspecified: Secondary | ICD-10-CM

## 2023-12-03 DIAGNOSIS — D509 Iron deficiency anemia, unspecified: Secondary | ICD-10-CM

## 2023-12-03 DIAGNOSIS — N9089 Other specified noninflammatory disorders of vulva and perineum: Secondary | ICD-10-CM | POA: Diagnosis not present

## 2023-12-03 DIAGNOSIS — E538 Deficiency of other specified B group vitamins: Secondary | ICD-10-CM

## 2023-12-03 DIAGNOSIS — Z113 Encounter for screening for infections with a predominantly sexual mode of transmission: Secondary | ICD-10-CM

## 2023-12-03 MED ORDER — MULTIVITAMIN GUMMIES ADULT PO CHEW
1.0000 | CHEWABLE_TABLET | Freq: Every day | ORAL | 1 refills | Status: AC
Start: 2023-12-03 — End: ?

## 2023-12-03 MED ORDER — FERROUS SULFATE 300 MG/6.8ML PO SOLN
7.0000 mL | Freq: Every day | ORAL | 5 refills | Status: AC
Start: 2023-12-03 — End: ?

## 2023-12-03 MED ORDER — VALACYCLOVIR HCL 500 MG PO TABS
500.0000 mg | ORAL_TABLET | Freq: Every day | ORAL | 3 refills | Status: AC
Start: 2023-12-03 — End: ?

## 2023-12-03 MED ORDER — FLUCONAZOLE 150 MG PO TABS: 150.0000 mg | ORAL_TABLET | ORAL | 0 refills | Status: DC

## 2023-12-03 NOTE — Progress Notes (Signed)
 Name: Stacy Stewart   MRN: [id number]    DOB: 20-Sep-2001   Date:12/03/2023       Progress Note  Subjective  Chief Complaint  Chief Complaint  Patient presents with   Vaginal Discharge   Vaginal Itching    Onset for few days, color dark and white. Pt would like to have vaginal exam    HPI  Discussed the use of AI scribe software for clinical note transcription with the patient, who gave verbal consent to proceed.  History of Present Illness   The patient, with a history of hidradenitis and herpes, presents with concerns of intermittent vulvar itching and irritation that started a week ago. The symptoms are localized to the external vulva and are not associated with any visible bumps or lesions. The patient denies any recent sexual activity, with the last intercourse reported at the end of November. She denies any severe pain, fever, chills, or nausea. The patient has been using a pH balancing wash for symptom management.  The patient's menstrual cycle is regular, with the last period starting on December 12th. She is not currently on any form of birth control and uses condoms during sexual intercourse. The patient has been taking Valtrex  for herpes management but reports no recent outbreaks. She also takes B12, and vitamin D  supplements, and a multivitamin.  The patient has a history of iron  deficiency anemia and prediabetes, with a family history of diabetes. She has been advised to reduce carbohydrate intake. The patient has not  been taking iron  supplements only a MVI in the form of gummies due to difficulty swallowing pills. 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         Patient Active Problem List   Diagnosis Date Noted   Axillary hidradenitis suppurativa 10/07/2018   Vitamin D  deficiency 10/01/2016   B12 deficiency 10/01/2016   Acanthosis nigricans 09/02/2015   Allergic rhinitis 09/02/2015    History reviewed. No pertinent surgical  history.  Family History  Problem Relation Age of Onset   Obesity Mother    Asthma Father    Asthma Sister     Social History   Tobacco Use   Smoking status: Never   Smokeless tobacco: Never  Substance Use Topics   Alcohol use: No    Alcohol/week: 0.0 standard drinks of alcohol     Current Outpatient Medications:    Cholecalciferol (VITAMIN D ) 50 MCG (2000 UT) CAPS, Take 1 capsule (2,000 Units total) by mouth daily., Disp: , Rfl:    Cyanocobalamin  (B-12) 1000 MCG SUBL, Place 1 tablet under the tongue daily., Disp: 30 tablet, Rfl: 5   valACYclovir  (VALTREX ) 500 MG tablet, Take 1 tablet (500 mg total) by mouth daily. But three times daily during outbreaks, Disp: 100 tablet, Rfl: 1  No Known Allergies  I personally reviewed active problem list, medication list, allergies, family history with the patient/caregiver today.   ROS  Ten systems reviewed and is negative except as mentioned in HPI   Objective  Vitals:   12/03/23 0904  BP: 122/76  Pulse: 84  Resp: 16  Temp: 98.2 F (36.8 C)  TempSrc: Oral  SpO2: 97%  Weight: 171 lb 12.8 oz (77.9 kg)  Height: 5' 3 (1.6 m)    Body mass index is 30.43 kg/m.  Physical Exam  Constitutional: Patient appears well-developed and well-nourished. Obese  No distress.  HEENT: head atraumatic, normocephalic, pupils equal and reactive to light, neck supple Cardiovascular: Normal  rate, regular rhythm and normal heart sounds.  No murmur heard. No BLE edema. Pulmonary/Chest: Effort normal and breath sounds normal. No respiratory distress. Genitalia: vulva is normal, pelvic exam showed white creamy vaginal discharge, normal cervix Abdominal: Soft.  There is no tenderness. Psychiatric: Patient has a normal mood and affect. behavior is normal. Judgment and thought content normal.   Diabetic Foot Exam:     PHQ2/9:    12/03/2023    9:05 AM 07/12/2023    1:06 PM 05/31/2023    3:02 PM 12/03/2022    8:19 AM 06/23/2022    8:10 AM   Depression screen PHQ 2/9  Decreased Interest 0 0 1 0 1  Down, Depressed, Hopeless 0 0 0 0 1  PHQ - 2 Score 0 0 1 0 2  Altered sleeping 0 0 0 0 0  Tired, decreased energy 0 0 0 0 0  Change in appetite 0 0 0 0 0  Feeling bad or failure about yourself  0 0 0 0 0  Trouble concentrating 0 0 0 0 0  Moving slowly or fidgety/restless 0 0 0 0 0  Suicidal thoughts 0 0 0 0 0  PHQ-9 Score 0 0 1 0 2  Difficult doing work/chores Not difficult at all  Not difficult at all  Not difficult at all    phq 9 is negative   Fall Risk:    12/03/2023    8:59 AM 07/12/2023    1:06 PM 05/31/2023    3:02 PM 12/03/2022    8:19 AM 06/23/2022    8:09 AM  Fall Risk   Falls in the past year? 0 0 0 0 0  Number falls in past yr: 0 0  0 0  Injury with Fall? 0 0  0 0  Risk for fall due to : No Fall Risks No Fall Risks No Fall Risks No Fall Risks No Fall Risks  Follow up Falls prevention discussed;Education provided;Falls evaluation completed Falls prevention discussed Falls prevention discussed Falls prevention discussed Falls prevention discussed;Education provided     Assessment & Plan   Assessment and Plan    Vulvar Pruritus Intermittent vulvar itching and irritation for the past week. No associated vaginal discharge, pelvic pain, fever, or chills. No recent changes in sexual partners or practices. -Perform pelvic exam to further evaluate.  Iron  Deficiency Anemia Persistent low hemoglobin in the tens. History of iron  deficiency anemia. Currently not taking iron  supplements due to difficulty swallowing pills. -Prescribe liquid ferrous sulfate  300mg  /6.4 ml take  7mL daily. -Check CBC and iron  levels today and at follow-up visit.  Prediabetes Elevated A1C indicating prediabetes. Family history of diabetes. -Advise on dietary modifications including reducing intake of carbohydrates, desserts, and sweet beverages.  Herpes Simplex Virus No recent outbreaks. Currently taking Valtrex . -Refill Valtrex   prescription for one year.  Vitamin Deficiencies History of low B12 and folic acid. Currently taking B12 sublingually and a multivitamin. -Continue B12 sublingual and multivitamin. -Check B12 levels at follow-up visit.  Sexually Transmitted Disease Screening Last sexual intercourse in late November. No new partners. Uses condoms. -Check for syphilis and HIV today.  Follow-up Plans -Schedule follow-up visit in 3 months to recheck labs and assess response to iron  supplementation.

## 2023-12-04 LAB — RPR: RPR Ser Ql: NONREACTIVE

## 2023-12-04 LAB — CBC WITH DIFFERENTIAL/PLATELET
Absolute Lymphocytes: 1313 {cells}/uL (ref 850–3900)
Absolute Monocytes: 409 {cells}/uL (ref 200–950)
Basophils Absolute: 27 {cells}/uL (ref 0–200)
Basophils Relative: 0.4 %
Eosinophils Absolute: 87 {cells}/uL (ref 15–500)
Eosinophils Relative: 1.3 %
HCT: 38.6 % (ref 35.0–45.0)
Hemoglobin: 12 g/dL (ref 11.7–15.5)
MCH: 23.8 pg — ABNORMAL LOW (ref 27.0–33.0)
MCHC: 31.1 g/dL — ABNORMAL LOW (ref 32.0–36.0)
MCV: 76.4 fL — ABNORMAL LOW (ref 80.0–100.0)
MPV: 11.9 fL (ref 7.5–12.5)
Monocytes Relative: 6.1 %
Neutro Abs: 4864 {cells}/uL (ref 1500–7800)
Neutrophils Relative %: 72.6 %
Platelets: 310 10*3/uL (ref 140–400)
RBC: 5.05 10*6/uL (ref 3.80–5.10)
RDW: 15 % (ref 11.0–15.0)
Total Lymphocyte: 19.6 %
WBC: 6.7 10*3/uL (ref 3.8–10.8)

## 2023-12-04 LAB — HIV ANTIBODY (ROUTINE TESTING W REFLEX): HIV 1&2 Ab, 4th Generation: NONREACTIVE

## 2023-12-04 LAB — IRON,TIBC AND FERRITIN PANEL
%SAT: 6 % — ABNORMAL LOW (ref 16–45)
Ferritin: 7 ng/mL — ABNORMAL LOW (ref 16–154)
Iron: 27 ug/dL — ABNORMAL LOW (ref 40–190)
TIBC: 441 ug/dL (ref 250–450)

## 2023-12-06 LAB — CERVICOVAGINAL ANCILLARY ONLY
Bacterial Vaginitis (gardnerella): POSITIVE — AB
Candida Glabrata: NEGATIVE
Candida Vaginitis: POSITIVE — AB
Chlamydia: NEGATIVE
Comment: NEGATIVE
Comment: NEGATIVE
Comment: NEGATIVE
Comment: NEGATIVE
Comment: NEGATIVE
Comment: NORMAL
Neisseria Gonorrhea: NEGATIVE
Trichomonas: NEGATIVE

## 2023-12-07 ENCOUNTER — Encounter: Payer: Self-pay | Admitting: Family Medicine

## 2023-12-07 ENCOUNTER — Other Ambulatory Visit: Payer: Self-pay | Admitting: Family Medicine

## 2023-12-07 DIAGNOSIS — B9689 Other specified bacterial agents as the cause of diseases classified elsewhere: Secondary | ICD-10-CM

## 2023-12-07 MED ORDER — METRONIDAZOLE 0.75 % VA GEL: 1.0000 | Freq: Two times a day (BID) | VAGINAL | 0 refills | Status: DC

## 2023-12-15 ENCOUNTER — Encounter: Payer: Self-pay | Admitting: Family Medicine

## 2024-06-28 ENCOUNTER — Encounter: Payer: Self-pay | Admitting: Family Medicine

## 2024-06-30 ENCOUNTER — Ambulatory Visit: Admitting: Family Medicine

## 2024-06-30 ENCOUNTER — Other Ambulatory Visit (HOSPITAL_COMMUNITY)
Admission: RE | Admit: 2024-06-30 | Discharge: 2024-06-30 | Disposition: A | Discharge status: Home / Self Care | Source: Ambulatory Visit | Attending: Family Medicine | Admitting: Family Medicine

## 2024-06-30 VITALS — BP 102/62 | HR 81 | Resp 16 | Ht 63.0 in | Wt 166.4 lb

## 2024-06-30 DIAGNOSIS — N898 Other specified noninflammatory disorders of vagina: Secondary | ICD-10-CM

## 2024-06-30 DIAGNOSIS — B9689 Other specified bacterial agents as the cause of diseases classified elsewhere: Secondary | ICD-10-CM

## 2024-06-30 DIAGNOSIS — Z3201 Encounter for pregnancy test, result positive: Secondary | ICD-10-CM

## 2024-06-30 DIAGNOSIS — E559 Vitamin D deficiency, unspecified: Secondary | ICD-10-CM

## 2024-06-30 DIAGNOSIS — E538 Deficiency of other specified B group vitamins: Secondary | ICD-10-CM | POA: Diagnosis not present

## 2024-06-30 DIAGNOSIS — N760 Acute vaginitis: Secondary | ICD-10-CM

## 2024-06-30 DIAGNOSIS — Z3009 Encounter for other general counseling and advice on contraception: Secondary | ICD-10-CM

## 2024-06-30 DIAGNOSIS — N926 Irregular menstruation, unspecified: Secondary | ICD-10-CM

## 2024-06-30 DIAGNOSIS — D509 Iron deficiency anemia, unspecified: Secondary | ICD-10-CM

## 2024-06-30 LAB — POCT URINE PREGNANCY: Preg Test, Ur: POSITIVE — AB

## 2024-06-30 MED ORDER — METRONIDAZOLE 0.75 % VA GEL: 1.0000 | Freq: Two times a day (BID) | VAGINAL | 0 refills | Status: DC

## 2024-06-30 MED ORDER — NORGESTIMATE-ETH ESTRADIOL 0.25-35 MG-MCG PO TABS: 1.0000 | ORAL_TABLET | Freq: Every day | ORAL | 3 refills | Status: AC

## 2024-06-30 NOTE — Progress Notes (Signed)
 Name: Stacy Stewart   MRN: [id number]    DOB: 01/21/2001   Date:06/30/2024       Progress Note  Subjective  Chief Complaint  Chief Complaint  Patient presents with   Vaginal Discharge    White/creamy few days   Vaginal Itching    Discussed the use of AI scribe software for clinical note transcription with the patient, who gave verbal consent to proceed.  History of Present Illness Stacy Stewart is a 23 year old female who presents with vaginal discharge.  She has been experiencing a thick, white, and creamy vaginal discharge that began yesterday, accompanied by irritation and itching that started the day before. She has a history of bacterial vaginosis and has had a yeast infection in the past, though she is unsure if it was related to the bacterial vaginosis. The discharge is primarily creamy and thick, with general itching.  Her last menstrual cycle has not started yet, although it is expected to begin soon. She is not currently on birth control, having previously stopped due to lack of compliance. She has a consistent sexual partner and practices safe sex with condom use every time.  She has a history of vitamin D  deficiency, B12 deficiency, and iron  deficiency anemia. She is currently taking multivitamin gummies but not specific supplements for these deficiencies. Her anemia has improved in the past, with hemoglobin levels increasing from 10 to 12, but her iron  storage was previously low. Her vitamin D  levels improved from 7 to 25 with supplementation, and her B12 and folic acid levels were also low but improved with treatment.  No chest pain, palpitations, nausea, or vomiting. She reports feeling generally good.    Patient Active Problem List   Diagnosis Date Noted   Axillary hidradenitis suppurativa 10/07/2018   Vitamin D  deficiency 10/01/2016   B12 deficiency 10/01/2016   Acanthosis nigricans 09/02/2015   Allergic rhinitis 09/02/2015    Social History   Tobacco Use   Smoking  status: Never   Smokeless tobacco: Never  Substance Use Topics   Alcohol use: No    Alcohol/week: 0.0 standard drinks of alcohol     Current Outpatient Medications:    Multiple Vitamins-Minerals (MULTIVITAMIN GUMMIES ADULT) CHEW, Chew 1 each by mouth daily at 2 PM., Disp: 100 tablet, Rfl: 1   valACYclovir  (VALTREX ) 500 MG tablet, Take 1 tablet (500 mg total) by mouth daily. But three times daily during outbreaks, Disp: 100 tablet, Rfl: 3   Cholecalciferol (VITAMIN D ) 50 MCG (2000 UT) CAPS, Take 1 capsule (2,000 Units total) by mouth daily. (Patient not taking: Reported on 06/30/2024), Disp: , Rfl:    Cyanocobalamin  (B-12) 1000 MCG SUBL, Place 1 tablet under the tongue daily. (Patient not taking: Reported on 06/30/2024), Disp: 30 tablet, Rfl: 5   Ferrous Sulfate  300 MG/6.8ML SOLN, Take 7 mLs by mouth daily at 12 noon. (Patient not taking: Reported on 06/30/2024), Disp: 473 mL, Rfl: 5   fluconazole  (DIFLUCAN ) 150 MG tablet, Take 1 tablet (150 mg total) by mouth every other day. (Patient not taking: Reported on 06/30/2024), Disp: 3 tablet, Rfl: 0   metroNIDAZOLE  (METROGEL ) 0.75 % vaginal gel, Place 1 Applicatorful vaginally 2 (two) times daily. (Patient not taking: Reported on 06/30/2024), Disp: 70 g, Rfl: 0  No Known Allergies  ROS  Ten systems reviewed and is negative except as mentioned in HPI    Objective  Vitals:   06/30/24 1114  BP: 102/62  Pulse: 81  Resp: 16  SpO2: 98%  Weight: 166 lb 6.4 oz (75.5 kg)  Height: 5' 3 (1.6 m)    Body mass index is 29.48 kg/m.    Physical Exam CONSTITUTIONAL: Patient appears well-developed and well-nourished.  No distress. HEENT: Head atraumatic, normocephalic, neck supple. CARDIOVASCULAR: Normal rate, regular rhythm and normal heart sounds.  No murmur heard. No BLE edema. PULMONARY: Effort normal and breath sounds normal. No respiratory distress. ABDOMINAL: There is no tenderness or distention. MUSCULOSKELETAL: Normal gait. Without gross motor  or sensory deficit. PSYCHIATRIC: Patient has a normal mood and affect. behavior is normal. Judgment and thought content normal.  Assessment & Plan Early pregnancy counseling and management options Positive pregnancy test at about  four weeks gestation. Emphasized personal decision-making regarding pregnancy continuation. [facility]ed [facility] if not continuing. - [facility] for counseling if not continuing pregnancy. - Do not start birth control until decision is made. - If continuing pregnancy, start prenatal vitamins.  Vaginal discharge evaluation in the context of early pregnancy and recurrent bacterial vaginosis Thick, white, creamy discharge with itching. Differential includes bacterial vaginosis and pregnancy-related discharge. Discussed metrogel  but advised waiting for test results due to pregnancy. - Wait for test results before starting metrogel . - If bacterial vaginosis is confirmed, consider metrogel  treatment.  Iron  deficiency anemia Iron  deficiency anemia with previous hemoglobin improvement. Not currently taking iron  supplements, risking decline in iron  levels. - Resume iron  supplements to prevent decline in iron  levels.  Vitamin D  deficiency Vitamin D  deficiency previously improved with supplementation. Not currently taking supplements, risking decline in levels. - Resume vitamin D  supplementation at 2000 IU daily.  Vitamin B12 deficiency Vitamin B12 deficiency previously improved. Not currently taking B12 supplements, risking decline in levels. Multivitamin may not provide adequate B12. - Resume sublingual B12 supplementation.

## 2024-07-03 ENCOUNTER — Ambulatory Visit: Payer: Self-pay | Admitting: Internal Medicine

## 2024-07-03 DIAGNOSIS — B379 Candidiasis, unspecified: Secondary | ICD-10-CM

## 2024-07-03 LAB — CERVICOVAGINAL ANCILLARY ONLY
Bacterial Vaginitis (gardnerella): NEGATIVE
Candida Glabrata: NEGATIVE
Candida Vaginitis: POSITIVE — AB
Chlamydia: NEGATIVE
Comment: NEGATIVE
Comment: NEGATIVE
Comment: NEGATIVE
Comment: NEGATIVE
Comment: NEGATIVE
Comment: NORMAL
Neisseria Gonorrhea: NEGATIVE
Trichomonas: NEGATIVE

## 2024-07-04 MED ORDER — CLOTRIMAZOLE 1 % VA CREA: 1.0000 | TOPICAL_CREAM | Freq: Every day | VAGINAL | 0 refills | Status: AC

## 2024-07-14 ENCOUNTER — Other Ambulatory Visit (HOSPITAL_COMMUNITY)
Admission: RE | Admit: 2024-07-14 | Discharge: 2024-07-14 | Disposition: A | Discharge status: Home / Self Care | Source: Ambulatory Visit | Attending: Family Medicine | Admitting: Family Medicine

## 2024-07-14 ENCOUNTER — Encounter: Payer: Self-pay | Admitting: Family Medicine

## 2024-07-14 ENCOUNTER — Ambulatory Visit: Payer: Self-pay | Admitting: Family Medicine

## 2024-07-14 VITALS — BP 112/68 | HR 98 | Resp 16 | Ht 63.5 in | Wt 162.2 lb

## 2024-07-14 DIAGNOSIS — E559 Vitamin D deficiency, unspecified: Secondary | ICD-10-CM

## 2024-07-14 DIAGNOSIS — Z124 Encounter for screening for malignant neoplasm of cervix: Secondary | ICD-10-CM

## 2024-07-14 DIAGNOSIS — Z23 Encounter for immunization: Secondary | ICD-10-CM | POA: Diagnosis not present

## 2024-07-14 DIAGNOSIS — B379 Candidiasis, unspecified: Secondary | ICD-10-CM

## 2024-07-14 DIAGNOSIS — D509 Iron deficiency anemia, unspecified: Secondary | ICD-10-CM

## 2024-07-14 DIAGNOSIS — Z332 Encounter for elective termination of pregnancy: Secondary | ICD-10-CM

## 2024-07-14 DIAGNOSIS — Z0001 Encounter for general adult medical examination with abnormal findings: Secondary | ICD-10-CM | POA: Diagnosis not present

## 2024-07-14 DIAGNOSIS — E538 Deficiency of other specified B group vitamins: Secondary | ICD-10-CM

## 2024-07-14 DIAGNOSIS — Z0000 Encounter for general adult medical examination without abnormal findings: Secondary | ICD-10-CM

## 2024-07-14 DIAGNOSIS — Z113 Encounter for screening for infections with a predominantly sexual mode of transmission: Secondary | ICD-10-CM

## 2024-07-14 MED ORDER — FLUCONAZOLE 150 MG PO TABS: 150.0000 mg | ORAL_TABLET | Freq: Once | ORAL | 0 refills | Status: AC

## 2024-07-14 NOTE — Patient Instructions (Signed)
Preventive Care 18-21 Years Old, Female Preventive care refers to lifestyle choices and visits with your health care provider that can promote health and wellness. At this stage in your life, you may start seeing a primary care physician instead of a pediatrician for your preventive care. Preventive care visits are also called wellness exams. What can I expect for my preventive care visit? Counseling During your preventive care visit, your health care provider may ask about your: Medical history, including: Past medical problems. Family medical history. Pregnancy history. Current health, including: Menstrual cycle. Method of birth control. Emotional well-being. Home life and relationship well-being. Sexual activity and sexual health. Lifestyle, including: Alcohol, nicotine or tobacco, and drug use. Access to firearms. Diet, exercise, and sleep habits. Sunscreen use. Motor vehicle safety. Physical exam Your health care provider may check your: Height and weight. These may be used to calculate your BMI (body mass index). BMI is a measurement that tells if you are at a healthy weight. Waist circumference. This measures the distance around your waistline. This measurement also tells if you are at a healthy weight and may help predict your risk of certain diseases, such as type 2 diabetes and high blood pressure. Heart rate and blood pressure. Body temperature. Skin for abnormal spots. Breasts. What immunizations do I need?  Vaccines are usually given at various ages, according to a schedule. Your health care provider will recommend vaccines for you based on your age, medical history, and lifestyle or other factors, such as travel or where you work. What tests do I need? Screening Your health care provider may recommend screening tests for certain conditions. This may include: Vision and hearing tests. Lipid and cholesterol levels. Pelvic exam and Pap test. Hepatitis B  test. Hepatitis C test. HIV (human immunodeficiency virus) test. STI (sexually transmitted infection) testing, if you are at risk. Tuberculosis skin test if you have symptoms. BRCA-related cancer screening. This may be done if you have a family history of breast, ovarian, tubal, or peritoneal cancers. Talk with your health care provider about your test results, treatment options, and if necessary, the need for more tests. Follow these instructions at home: Eating and drinking  Eat a healthy diet that includes fresh fruits and vegetables, whole grains, lean protein, and low-fat dairy products. Drink enough fluid to keep your urine pale yellow. Do not drink alcohol if: Your health care provider tells you not to drink. You are pregnant, may be pregnant, or are planning to become pregnant. You are under the legal drinking age. In the [place] the legal drinking age is 21. If you drink alcohol: Limit how much you have to 0-1 drink a day. Know how much alcohol is in your drink. In the [place] one drink equals one 12 oz bottle of beer (355 mL), one 5 oz glass of wine (148 mL), or one 1 oz glass of hard liquor (44 mL). Lifestyle Brush your teeth every morning and night with fluoride toothpaste. Floss one time each day. Exercise for at least 30 minutes 5 or more days of the week. Do not use any products that contain nicotine or tobacco. These products include cigarettes, chewing tobacco, and vaping devices, such as e-cigarettes. If you need help quitting, ask your health care provider. Do not use drugs. If you are sexually active, practice safe sex. Use a condom or other form of protection to prevent STIs. If you do not wish to become pregnant, use a form of birth control. If you plan to become pregnant,   see your health care provider for a prepregnancy visit. Find healthy ways to manage stress, such as: Meditation, yoga, or listening to music. Journaling. Talking to a trusted person. Spending time  with friends and family. Safety Always wear your seat belt while driving or riding in a vehicle. Do not drive: If you have been drinking alcohol. Do not ride with someone who has been drinking. When you are tired or distracted. While texting. If you have been using any mind-altering substances or drugs. Wear a helmet and other protective equipment during sports activities. If you have firearms in your house, make sure you follow all gun safety procedures. Seek help if you have been bullied, physically abused, or sexually abused. Use the internet responsibly to avoid dangers, such as online bullying and online sex predators. What's next? Go to your health care provider once a year for an annual wellness visit. Ask your health care provider how often you should have your eyes and teeth checked. Stay up to date on all vaccines. This information is not intended to replace advice given to you by your health care provider. Make sure you discuss any questions you have with your health care provider. Document Revised: 05/14/2021 Document Reviewed: 05/14/2021 Elsevier Patient Education  2024 Elsevier Inc.  

## 2024-07-14 NOTE — Progress Notes (Signed)
 Name: Stacy Stewart   MRN: [id number]    DOB: [birth year]   Date:07/14/2024       Progress Note  Subjective  Chief Complaint  Chief Complaint  Patient presents with   Annual Exam    HPI  Patient presents for annual CPE.  Diet: eating mostly at home, mostly baked  Exercise:  continue regular physical activity  Last Eye Exam: encouraged to complete Last Dental Exam: completed  Flowsheet Row Office Visit from 06/30/2024 in [facility]  AUDIT-C Score 4    Depression: Phq 9 is  negative    07/14/2024   10:52 AM 06/30/2024   11:13 AM 12/03/2023    9:05 AM 07/12/2023    1:06 PM 05/31/2023    3:02 PM  Depression screen PHQ 2/9  Decreased Interest 0 0 0 0 1  Down, Depressed, Hopeless 0 0 0 0 0  PHQ - 2 Score 0 0 0 0 1  Altered sleeping   0 0 0  Tired, decreased energy   0 0 0  Change in appetite   0 0 0  Feeling bad or failure about yourself    0 0 0  Trouble concentrating   0 0 0  Moving slowly or fidgety/restless   0 0 0  Suicidal thoughts   0 0 0  PHQ-9 Score   0 0 1  Difficult doing work/chores   Not difficult at all  Not difficult at all   Hypertension: BP Readings from Last 3 Encounters:  07/14/24 112/68  06/30/24 102/62  12/03/23 122/76   Obesity: Wt Readings from Last 3 Encounters:  07/14/24 162 lb 3.2 oz (73.6 kg)  06/30/24 166 lb 6.4 oz (75.5 kg)  12/03/23 171 lb 12.8 oz (77.9 kg)   BMI Readings from Last 3 Encounters:  07/14/24 28.28 kg/m  06/30/24 29.48 kg/m  12/03/23 30.43 kg/m     Vaccines: reviewed with the patient.   Hep C Screening: completed STD testing and prevention (HIV/chl/gon/syphilis): today  Intimate partner violence: negative screen  Sexual History : not sexually active since July. She has one partner, got pregnant - but had an elective abortion last week by taking medications. She states cramping resolved, now only bleeding lightly, no fever or chills. Has occasional discomfort left flank  Menstrual  History/LMP/Abnormal Bleeding: cycles are usually regular, she will start taking ocp today, explained it may take 3 months to prevent pregnancy  Discussed importance of follow up if any post-menopausal bleeding: not applicable  Incontinence Symptoms: negative for symptoms   Breast cancer:  - Last Mammogram: N/A - BRCA gene screening: N/A  Osteoporosis Prevention : Discussed high calcium and vitamin D  supplementation, weight bearing exercises Bone density :not applicable   Cervical cancer screening: performing today  Skin cancer: Discussed monitoring for atypical lesions  Colorectal cancer: N/A    A voluntary discussion about advance care planning including the explanation and discussion of advance directives.  Discussed health care proxy and Living will, and the patient was able to identify a health care proxy as mother .  Patient does not have a living will and power of attorney of health care   Patient Active Problem List   Diagnosis Date Noted   Axillary hidradenitis suppurativa 10/07/2018   Vitamin D  deficiency 10/01/2016   B12 deficiency 10/01/2016   Acanthosis nigricans 09/02/2015   Allergic rhinitis 09/02/2015    History reviewed. No pertinent surgical history.  Family History  Problem Relation Age of Onset  Obesity Mother    Asthma Father    Asthma Sister     Social History   Socioeconomic History   Marital status: Single    Spouse name: Not on file   Number of children: 0   Years of education: Not on file   Highest education level: Bachelor's degree (e.g., BA, AB, BS)  Occupational History   Occupation: Consulting civil engineer    Comment: [employer]  Tobacco Use   Smoking status: Never   Smokeless tobacco: Never  Vaping Use   Vaping status: Never Used  Substance and Sexual Activity   Alcohol use: No   Drug use: No   Sexual activity: Yes    Birth control/protection: Condom, Pill  Other Topics Concern   Not on file  Social History Narrative   Lives on  campus - [employer] during school year, she is a Chief Strategy Officer - criminal justice   Comes home for the Summer and works full time    Social Drivers of Corporate investment banker Strain: Low Risk  (06/30/2024)   Overall Financial Resource Strain (CARDIA)    Difficulty of Paying Living Expenses: Not hard at all  Food Insecurity: No Food Insecurity (06/30/2024)   Hunger Vital Sign    Worried About Running Out of Food in the Last Year: Never true    Ran Out of Food in the Last Year: Never true  Transportation Needs: No Transportation Needs (06/30/2024)   PRAPARE - Administrator, Civil Service (Medical): No    Lack of Transportation (Non-Medical): No  Physical Activity: Insufficiently Active (06/30/2024)   Exercise Vital Sign    Days of Exercise per Week: 4 days    Minutes of Exercise per Session: 30 min  Stress: No Stress Concern Present (06/30/2024)   [employer] of Occupational Health - Occupational Stress Questionnaire    Feeling of Stress: Not at all  Social Connections: Moderately Integrated (06/30/2024)   Social Connection and Isolation Panel    Frequency of Communication with Friends and Family: More than three times a week    Frequency of Social Gatherings with Friends and Family: Twice a week    Attends Religious Services: More than 4 times per year    Active Member of [employer] or Organizations: Yes    Attends Banker Meetings: 1 to 4 times per year    Marital Status: Never married  Intimate Partner Violence: Not At Risk (07/14/2024)   Humiliation, Afraid, Rape, and Kick questionnaire    Fear of Current or Ex-Partner: No    Emotionally Abused: No    Physically Abused: No    Sexually Abused: No     Current Outpatient Medications:    fluconazole  (DIFLUCAN ) 150 MG tablet, Take 1 tablet (150 mg total) by mouth once for 1 dose., Disp: 1 tablet, Rfl: 0   Multiple Vitamins-Minerals (MULTIVITAMIN GUMMIES ADULT) CHEW, Chew 1 each by mouth daily at 2 PM., Disp: 100  tablet, Rfl: 1   norgestimate -ethinyl estradiol  (SPRINTEC 28) 0.25-35 MG-MCG tablet, Take 1 tablet by mouth daily., Disp: 84 tablet, Rfl: 3   valACYclovir  (VALTREX ) 500 MG tablet, Take 1 tablet (500 mg total) by mouth daily. But three times daily during outbreaks, Disp: 100 tablet, Rfl: 3   Cholecalciferol (VITAMIN D ) 50 MCG (2000 UT) CAPS, Take 1 capsule (2,000 Units total) by mouth daily. (Patient not taking: Reported on 07/14/2024), Disp: , Rfl:    Cyanocobalamin  (B-12) 1000 MCG SUBL, Place 1 tablet under the  tongue daily. (Patient not taking: Reported on 07/14/2024), Disp: 30 tablet, Rfl: 5   Ferrous Sulfate  300 MG/6.8ML SOLN, Take 7 mLs by mouth daily at 12 noon. (Patient not taking: Reported on 07/14/2024), Disp: 473 mL, Rfl: 5  No Known Allergies   ROS  Constitutional: Negative for fever or weight change.  Respiratory: Negative for cough and shortness of breath.   Cardiovascular: Negative for chest pain or palpitations.  Gastrointestinal: Negative for abdominal pain, no bowel changes.  Musculoskeletal: Negative for gait problem or joint swelling.  Skin: Negative for rash.  Neurological: Negative for dizziness or headache.  No other specific complaints in a complete review of systems (except as listed in HPI above).   Objective  Vitals:   07/14/24 1057  BP: 112/68  Pulse: 98  Resp: 16  SpO2: 98%  Weight: 162 lb 3.2 oz (73.6 kg)  Height: 5' 3.5 (1.613 m)    Body mass index is 28.28 kg/m.  Physical Exam  Constitutional: Patient appears well-developed and well-nourished. No distress.  HENT: Head: Normocephalic and atraumatic. Ears: B TMs ok, no erythema or effusion; Nose: Nose normal. Mouth/Throat: Oropharynx is clear and moist. No oropharyngeal exudate.  Eyes: Conjunctivae and EOM are normal. Pupils are equal, round, and reactive to light. No scleral icterus.  Neck: Normal range of motion. Neck supple. No JVD present. No thyromegaly present.  Cardiovascular: Normal rate,  regular rhythm and normal heart sounds.  No murmur heard. No BLE edema. Pulmonary/Chest: Effort normal and breath sounds normal. No respiratory distress. Abdominal: Soft. Bowel sounds are normal, no distension. There is no tenderness. no masses Breast: no lumps or masses, no nipple discharge or rashes FEMALE GENITALIA:  External genitalia normal External urethra normal Vaginal vault normal without discharge or lesions Cervix normal without discharge or lesions Bimanual exam normal without masses. Complaining of mild LLQ pain, she [facility]  RECTAL: no rectal masses or hemorrhoids Musculoskeletal: Normal range of motion, no joint effusions. No gross deformities Neurological: he is alert and oriented to person, place, and time. No cranial nerve deficit. Coordination, balance, strength, speech and gait are normal.  Skin: Skin is warm and dry. No rash noted. No erythema.  Psychiatric: Patient has a normal mood and affect. behavior is normal. Judgment and thought content normal.     Assessment & Plan  1. Screening for malignant neoplasm of cervix  - Cytology - PAP  2. Well adult exam (Primary)  - Lipid panel - CBC with Differential/Platelet - Comprehensive metabolic panel with GFR - Iron , TIBC and Ferritin Panel - Hemoglobin A1c - B12 and Folate Panel  3. Need for Tdap vaccination  - Tdap vaccine greater than or equal to 7yo IM  4. Yeast infection  - fluconazole  (DIFLUCAN ) 150 MG tablet; Take 1 tablet (150 mg total) by mouth once for 1 dose.  Dispense: 1 tablet; Refill: 0  5. Vitamin D  deficiency  - VITAMIN D  25 Hydroxy (Vit-D Deficiency, Fractures)  6. B12 deficiency  - B12 and Folate Panel  7. Iron  deficiency anemia, unspecified iron  deficiency anemia type  - CBC with Differential/Platelet - Iron , TIBC and Ferritin Panel  8. Routine screening for STI (sexually transmitted infection)  HIV, syphilis , GC up to date  9. Folic acid  deficiency  - B12 and Folate Panel  10. Abortion in first trimester  - hCG, quantitative, pregnancy; Future    -USPSTF grade A and B recommendations reviewed with patient; age-appropriate recommendations, preventive care, screening tests, etc discussed  and encouraged; healthy living encouraged; see AVS for patient education given to patient -Discussed importance of 150 minutes of physical activity weekly, eat two servings of fish weekly, eat one serving of tree nuts ( cashews, pistachios, pecans, almonds.SABRA) every other day, eat 6 servings of fruit/vegetables daily and drink plenty of water and avoid sweet beverages.   -Reviewed Health Maintenance: Yes.

## 2024-07-17 ENCOUNTER — Ambulatory Visit: Payer: Self-pay | Admitting: Family Medicine

## 2024-07-18 LAB — CBC WITH DIFFERENTIAL/PLATELET
Absolute Lymphocytes: 1118 {cells}/uL (ref 850–3900)
Absolute Monocytes: 651 {cells}/uL (ref 200–950)
Basophils Absolute: 18 {cells}/uL (ref 0–200)
Basophils Relative: 0.2 %
Eosinophils Absolute: 44 {cells}/uL (ref 15–500)
Eosinophils Relative: 0.5 %
HCT: 32.4 % — ABNORMAL LOW (ref 35.0–45.0)
Hemoglobin: 9.9 g/dL — ABNORMAL LOW (ref 11.7–15.5)
MCH: 23.7 pg — ABNORMAL LOW (ref 27.0–33.0)
MCHC: 30.6 g/dL — ABNORMAL LOW (ref 32.0–36.0)
MCV: 77.7 fL — ABNORMAL LOW (ref 80.0–100.0)
MPV: 10.8 fL (ref 7.5–12.5)
Monocytes Relative: 7.4 %
Neutro Abs: 6970 {cells}/uL (ref 1500–7800)
Neutrophils Relative %: 79.2 %
Platelets: 353 Thousand/uL (ref 140–400)
RBC: 4.17 Million/uL (ref 3.80–5.10)
RDW: 15.3 % — ABNORMAL HIGH (ref 11.0–15.0)
Total Lymphocyte: 12.7 %
WBC: 8.8 Thousand/uL (ref 3.8–10.8)

## 2024-07-18 LAB — COMPREHENSIVE METABOLIC PANEL WITH GFR
AG Ratio: 1.6 (calc) (ref 1.0–2.5)
ALT: 8 U/L (ref 6–29)
AST: 12 U/L (ref 10–30)
Albumin: 4.2 g/dL (ref 3.6–5.1)
Alkaline phosphatase (APISO): 45 U/L (ref 31–125)
BUN: 7 mg/dL (ref 7–25)
CO2: 25 mmol/L (ref 20–32)
Calcium: 9 mg/dL (ref 8.6–10.2)
Chloride: 103 mmol/L (ref 98–110)
Creat: 0.68 mg/dL (ref 0.50–0.96)
Globulin: 2.6 g/dL (ref 1.9–3.7)
Glucose, Bld: 84 mg/dL (ref 65–99)
Potassium: 4.1 mmol/L (ref 3.5–5.3)
Sodium: 137 mmol/L (ref 135–146)
Total Bilirubin: 0.4 mg/dL (ref 0.2–1.2)
Total Protein: 6.8 g/dL (ref 6.1–8.1)
eGFR: 127 mL/min/1.73m2 (ref >=60)

## 2024-07-18 LAB — TEST AUTHORIZATION

## 2024-07-18 LAB — IRON,TIBC AND FERRITIN PANEL
%SAT: 5 % — ABNORMAL LOW (ref 16–45)
Ferritin: 7 ng/mL — ABNORMAL LOW (ref 16–154)
Iron: 17 ug/dL — ABNORMAL LOW (ref 40–190)
TIBC: 339 ug/dL (ref 250–450)

## 2024-07-18 LAB — LIPID PANEL
Cholesterol: 127 mg/dL (ref <200)
HDL: 45 mg/dL — ABNORMAL LOW (ref >=50)
LDL Cholesterol (Calc): 69 mg/dL
Non-HDL Cholesterol (Calc): 82 mg/dL (ref <130)
Total CHOL/HDL Ratio: 2.8 (calc) (ref <5.0)
Triglycerides: 57 mg/dL (ref <150)

## 2024-07-18 LAB — HEMOGLOBIN A1C
Hgb A1c MFr Bld: 5.5 % (ref <5.7)
Mean Plasma Glucose: 111 mg/dL
eAG (mmol/L): 6.2 mmol/L

## 2024-07-18 LAB — B12 AND FOLATE PANEL
Folate: 5.1 ng/mL — ABNORMAL LOW
Vitamin B-12: 256 pg/mL (ref 200–1100)

## 2024-07-18 LAB — HCG, QUANTITATIVE, PREGNANCY: HCG, Total, QN: 994 m[IU]/mL

## 2024-07-18 LAB — VITAMIN D 25 HYDROXY (VIT D DEFICIENCY, FRACTURES): Vit D, 25-Hydroxy: 28 ng/mL — ABNORMAL LOW (ref 30–100)

## 2024-07-20 ENCOUNTER — Telehealth: Payer: Self-pay

## 2024-07-20 NOTE — Telephone Encounter (Signed)
 Lvm informing pt that Dr Glenard requesting repeat labs and she can stop by between 8-11:30 and 1:30-3:30. Dr Glenard also requesting she schedule appt next week if she still isn't any better.

## 2024-07-20 NOTE — Telephone Encounter (Signed)
 Copied from CRM #[id number]. Topic: Clinical - Lab/Test Results >> Jul 19, 2024  4:56 PM Stacy Stewart wrote: Reason for CRM: Patient returning missed phone call, advised clinic was reaching out regarding if she is still having pain. States she is still having a little pain but not as bad as when she came in. Mostly when she lays down and extends her left leg. Please advise # [phone]

## 2024-07-21 ENCOUNTER — Telehealth: Payer: Self-pay

## 2024-07-21 NOTE — Telephone Encounter (Signed)
 Copied from CRM [id number]. Topic: Clinical - Lab/Test Results >> Jul 20, 2024  4:37 PM Dedra B wrote: Reason for CRM: Pt returning call regarding call about labs. Relayed message to pt to stop by for lab work and schedule appt next week if not better. 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

## 2024-07-24 LAB — CYTOLOGY - PAP
Diagnosis: NEGATIVE
Diagnosis: REACTIVE

## 2024-07-26 ENCOUNTER — Telehealth: Payer: Self-pay | Admitting: Family Medicine

## 2024-07-26 NOTE — Telephone Encounter (Signed)
 Is it okay for Mliss to see her? You are completely booked.

## 2024-07-27 ENCOUNTER — Encounter: Payer: Self-pay | Admitting: Family Medicine

## 2024-07-27 NOTE — Telephone Encounter (Signed)
 Appt sch'd for 07/28/24 with Dr Glenard

## 2024-07-28 ENCOUNTER — Ambulatory Visit: Admitting: Family Medicine

## 2024-07-28 ENCOUNTER — Encounter: Payer: Self-pay | Admitting: Family Medicine

## 2024-07-28 VITALS — BP 98/62 | HR 97 | Resp 16 | Ht 63.5 in | Wt 161.8 lb

## 2024-07-28 DIAGNOSIS — R1032 Left lower quadrant pain: Secondary | ICD-10-CM

## 2024-07-28 LAB — COMPREHENSIVE METABOLIC PANEL WITH GFR
AG Ratio: 1.5 (calc) (ref 1.0–2.5)
ALT: 9 U/L (ref 6–29)
AST: 12 U/L (ref 10–30)
Albumin: 4.1 g/dL (ref 3.6–5.1)
Alkaline phosphatase (APISO): 41 U/L (ref 31–125)
BUN: 12 mg/dL (ref 7–25)
CO2: 24 mmol/L (ref 20–32)
Calcium: 9.1 mg/dL (ref 8.6–10.2)
Chloride: 105 mmol/L (ref 98–110)
Creat: 0.7 mg/dL (ref 0.50–0.96)
Globulin: 2.8 g/dL (ref 1.9–3.7)
Glucose, Bld: 87 mg/dL (ref 65–99)
Potassium: 4.7 mmol/L (ref 3.5–5.3)
Sodium: 137 mmol/L (ref 135–146)
Total Bilirubin: 0.3 mg/dL (ref 0.2–1.2)
Total Protein: 6.9 g/dL (ref 6.1–8.1)
eGFR: 126 mL/min/1.73m2 (ref >=60)

## 2024-07-28 LAB — CBC WITH DIFFERENTIAL/PLATELET
Absolute Lymphocytes: 1431 {cells}/uL (ref 850–3900)
Absolute Monocytes: 504 {cells}/uL (ref 200–950)
Basophils Absolute: 22 {cells}/uL (ref 0–200)
Basophils Relative: 0.3 %
Eosinophils Absolute: 22 {cells}/uL (ref 15–500)
Eosinophils Relative: 0.3 %
HCT: 31.6 % — ABNORMAL LOW (ref 35.0–45.0)
Hemoglobin: 9.9 g/dL — ABNORMAL LOW (ref 11.7–15.5)
MCH: 24 pg — ABNORMAL LOW (ref 27.0–33.0)
MCHC: 31.3 g/dL — ABNORMAL LOW (ref 32.0–36.0)
MCV: 76.5 fL — ABNORMAL LOW (ref 80.0–100.0)
MPV: 10.4 fL (ref 7.5–12.5)
Monocytes Relative: 6.9 %
Neutro Abs: 5322 {cells}/uL (ref 1500–7800)
Neutrophils Relative %: 72.9 %
Platelets: 484 Thousand/uL — ABNORMAL HIGH (ref 140–400)
RBC: 4.13 Million/uL (ref 3.80–5.10)
RDW: 14.8 % (ref 11.0–15.0)
Total Lymphocyte: 19.6 %
WBC: 7.3 Thousand/uL (ref 3.8–10.8)

## 2024-07-28 LAB — HCG, QUANTITATIVE, PREGNANCY: HCG, Total, QN: 111 m[IU]/mL

## 2024-07-28 NOTE — Progress Notes (Signed)
 Name: Stacy Stewart   MRN: [id number]    DOB: 2001/05/26   Date:07/28/2024       Progress Note  Subjective  Chief Complaint  Chief Complaint  Patient presents with   Groin Pain    L side still has some occasional pain, still bleeding since abortion   Discussed the use of AI scribe software for clinical note transcription with the patient, who gave verbal consent to proceed.  History of Present Illness Stacy Stewart is a 23 year old female who presents with left lower quadrant pain following a recent medical abortion.  She experiences left lower quadrant pain, described as 'achy' but not severe enough to prevent sleep. The pain is less frequent and less intense than initially, occurring mainly when she lies down. The pain was present at the time of her August 15th physical and has gradually improved since then.  She underwent a medical abortion after a positive pregnancy test on August 1st, using medication rather than a D&C for the abortion. [facility] but returned for a physical exam on August 15th, where a Pap smear was performed, showing normal results   No fever, chills, discharge, constipation, burning during urination, nausea, vomiting, or breast tenderness. She continues to experience mild bleeding since elective abortion.    Patient Active Problem List   Diagnosis Date Noted   Axillary hidradenitis suppurativa 10/07/2018   Vitamin D  deficiency 10/01/2016   B12 deficiency 10/01/2016   Acanthosis nigricans 09/02/2015   Allergic rhinitis 09/02/2015    History reviewed. No pertinent surgical history.  Family History  Problem Relation Age of Onset   Obesity Mother    Asthma Father    Asthma Sister     Social History   Tobacco Use   Smoking status: Never   Smokeless tobacco: Never  Substance Use Topics   Alcohol use: No     Current Outpatient Medications:    Multiple Vitamins-Minerals (MULTIVITAMIN GUMMIES  ADULT) CHEW, Chew 1 each by mouth daily at 2 PM., Disp: 100 tablet, Rfl: 1   norgestimate -ethinyl estradiol  (SPRINTEC 28) 0.25-35 MG-MCG tablet, Take 1 tablet by mouth daily., Disp: 84 tablet, Rfl: 3   valACYclovir  (VALTREX ) 500 MG tablet, Take 1 tablet (500 mg total) by mouth daily. But three times daily during outbreaks, Disp: 100 tablet, Rfl: 3   Cholecalciferol (VITAMIN D ) 50 MCG (2000 UT) CAPS, Take 1 capsule (2,000 Units total) by mouth daily. (Patient not taking: Reported on 07/28/2024), Disp: , Rfl:    Cyanocobalamin  (B-12) 1000 MCG SUBL, Place 1 tablet under the tongue daily. (Patient not taking: Reported on 07/28/2024), Disp: 30 tablet, Rfl: 5   Ferrous Sulfate  300 MG/6.8ML SOLN, Take 7 mLs by mouth daily at 12 noon. (Patient not taking: Reported on 07/28/2024), Disp: 473 mL, Rfl: 5  No Known Allergies  I personally reviewed active problem list, medication list, allergies, family history with the patient/caregiver today.   ROS  Ten systems reviewed and is negative except as mentioned in HPI    Objective Physical Exam CONSTITUTIONAL: Patient appears well-developed and well-nourished.  No distress. HEENT: Head atraumatic, normocephalic, neck supple. CARDIOVASCULAR: Normal rate, regular rhythm and normal heart sounds.  No murmur heard. No BLE edema. PULMONARY: Effort normal and breath sounds normal. No respiratory distress. PELVIC EXAM: normal exam, blood on cervical os, no mass or bimanual motion tenderness noticed MUSCULOSKELETAL: Normal gait. Without gross motor or sensory deficit. PSYCHIATRIC: Patient has a normal mood  and affect. behavior is normal. Judgment and thought content normal.  Vitals:   07/28/24 1113  BP: 98/62  Pulse: 97  Resp: 16  SpO2: 98%  Weight: 161 lb 12.8 oz (73.4 kg)  Height: 5' 3.5 (1.613 m)    Body mass index is 28.21 kg/m.  Recent Results (from the past 2160 hours)  Cervicovaginal ancillary only     Status: Abnormal   Collection Time: 06/30/24  11:47 AM  Result Value Ref Range   Neisseria Gonorrhea Negative    Chlamydia Negative    Trichomonas Negative    Bacterial Vaginitis (gardnerella) Negative    Candida Vaginitis Positive (A)    Candida Glabrata Negative    Comment Normal Reference Ranger Chlamydia - Negative    Comment      Normal Reference Range Neisseria Gonorrhea - Negative   Comment      Normal Reference Range Bacterial Vaginosis - Negative   Comment Normal Reference Range Candida Species - Negative    Comment Normal Reference Range Candida Galbrata - Negative    Comment Normal Reference Range Trichomonas - Negative   POCT urine pregnancy     Status: Abnormal   Collection Time: 06/30/24 11:59 AM  Result Value Ref Range   Preg Test, Ur Positive (A) Negative  Lipid panel     Status: Abnormal   Collection Time: 07/14/24 12:00 AM  Result Value Ref Range   Cholesterol 127 <200 mg/dL   HDL 45 (L) > OR = 50 mg/dL   Triglycerides 57 <849 mg/dL   LDL Cholesterol (Calc) 69 mg/dL (calc)    Comment: Reference range: <100 . Desirable range <100 mg/dL for primary prevention;   <70 mg/dL for patients with CHD or diabetic patients  with > or = 2 CHD risk factors. SABRA LDL-C is now calculated using the Martin-Hopkins  calculation, which is a validated novel method providing  better accuracy than the Friedewald equation in the  estimation of LDL-C.  Gladis APPLETHWAITE et al. SANDREA. 7986;689(80): 2061-2068  (http://education.[facility].com/faq/FAQ164)    Total CHOL/HDL Ratio 2.8 <5.0 (calc)   Non-HDL Cholesterol (Calc) 82 <869 mg/dL (calc)    Comment: For patients with diabetes plus 1 major ASCVD risk  factor, treating to a non-HDL-C goal of <100 mg/dL  (LDL-C of <29 mg/dL) is considered a therapeutic  option.   CBC with Differential/Platelet     Status: Abnormal   Collection Time: 07/14/24 12:00 AM  Result Value Ref Range   WBC 8.8 3.8 - 10.8 Thousand/uL   RBC 4.17 3.80 - 5.10 Million/uL   Hemoglobin 9.9 (L) 11.7 - 15.5  g/dL   HCT 67.5 (L) 64.9 - 54.9 %   MCV 77.7 (L) 80.0 - 100.0 fL   MCH 23.7 (L) 27.0 - 33.0 pg   MCHC 30.6 (L) 32.0 - 36.0 g/dL    Comment: For adults, a slight decrease in the calculated MCHC value (in the range of 30 to 32 g/dL) is most likely not clinically significant; however, it should be interpreted with caution in correlation with other red cell parameters and the patient's clinical condition.    RDW 15.3 (H) 11.0 - 15.0 %   Platelets 353 140 - 400 Thousand/uL   MPV 10.8 7.5 - 12.5 fL   Neutro Abs 6,970 1,500 - 7,800 cells/uL   Absolute Lymphocytes 1,118 850 - 3,900 cells/uL   Absolute Monocytes 651 200 - 950 cells/uL   Eosinophils Absolute 44 15 - 500 cells/uL   Basophils Absolute 18 0 - 200  cells/uL   Neutrophils Relative % 79.2 %   Total Lymphocyte 12.7 %   Monocytes Relative 7.4 %   Eosinophils Relative 0.5 %   Basophils Relative 0.2 %  Comprehensive metabolic panel with GFR     Status: None   Collection Time: 07/14/24 12:00 AM  Result Value Ref Range   Glucose, Bld 84 65 - 99 mg/dL    Comment: .            Fasting reference interval .    BUN 7 7 - 25 mg/dL   Creat 9.31 9.49 - 9.03 mg/dL   eGFR 872 > OR = 60 fO/fpw/8.26f7   BUN/Creatinine Ratio SEE NOTE: 6 - 22 (calc)    Comment:    Not Reported: BUN and Creatinine are within    reference range. .    Sodium 137 135 - 146 mmol/L   Potassium 4.1 3.5 - 5.3 mmol/L   Chloride 103 98 - 110 mmol/L   CO2 25 20 - 32 mmol/L   Calcium 9.0 8.6 - 10.2 mg/dL   Total Protein 6.8 6.1 - 8.1 g/dL   Albumin 4.2 3.6 - 5.1 g/dL   Globulin 2.6 1.9 - 3.7 g/dL (calc)   AG Ratio 1.6 1.0 - 2.5 (calc)   Total Bilirubin 0.4 0.2 - 1.2 mg/dL   Alkaline phosphatase (APISO) 45 31 - 125 U/L   AST 12 10 - 30 U/L   ALT 8 6 - 29 U/L  Iron , TIBC and Ferritin Panel     Status: Abnormal   Collection Time: 07/14/24 12:00 AM  Result Value Ref Range   Iron  17 (L) 40 - 190 mcg/dL   TIBC 660 749 - 549 mcg/dL (calc)   %SAT 5 (L) 16 - 45 %  (calc)   Ferritin 7 (L) 16 - 154 ng/mL  Hemoglobin A1c     Status: None   Collection Time: 07/14/24 12:00 AM  Result Value Ref Range   Hgb A1c MFr Bld 5.5 <5.7 %    Comment: For the purpose of screening for the presence of diabetes: . <5.7%       Consistent with the absence of diabetes 5.7-6.4%    Consistent with increased risk for diabetes             (prediabetes) > or =6.5%  Consistent with diabetes . This assay result is consistent with a decreased risk of diabetes. . Currently, no consensus exists regarding use of hemoglobin A1c for diagnosis of diabetes in children. . According to American Diabetes Association (ADA) guidelines, hemoglobin A1c <7.0% represents optimal control in non-pregnant diabetic patients. Different metrics may apply to specific patient populations.  Standards of Medical Care in Diabetes(ADA). .    Mean Plasma Glucose 111 mg/dL   eAG (mmol/L) 6.2 mmol/L  B12 and Folate Panel     Status: Abnormal   Collection Time: 07/14/24 12:00 AM  Result Value Ref Range   Vitamin B-12 256 200 - 1,100 pg/mL    Comment: . Please Note: Although the reference range for vitamin B12 is [phone] pg/mL, it has been reported that between 5 and 10% of patients with values between 200 and 400 pg/mL may experience neuropsychiatric and hematologic abnormalities due to occult B12 deficiency; less than 1% of patients with values above 400 pg/mL will have symptoms. .    Folate 5.1 (L) ng/mL    Comment:  Reference Range                            Low:           <3.4                            Borderline:    3.4-5.4                            Normal:        >5.4 .   VITAMIN D  25 Hydroxy (Vit-D Deficiency, Fractures)     Status: Abnormal   Collection Time: 07/14/24 12:00 AM  Result Value Ref Range   Vit D, 25-Hydroxy 28 (L) 30 - 100 ng/mL    Comment: Vitamin D  Status         25-OH Vitamin D : . Deficiency:                    <20  ng/mL Insufficiency:             20 - 29 ng/mL Optimal:                 > or = 30 ng/mL . For 25-OH Vitamin D  testing on patients on  D2-supplementation and patients for whom quantitation  of D2 and D3 fractions is required, the [facility](TM) 25-OH VIT D, (D2,D3), LC/MS/MS is recommended: order  code 07111 (patients >69yrs). . See Note 1 . Note 1 . For additional information, please refer to  http://education.[facility].com/faq/FAQ199  (This link is being provided for informational/ educational purposes only.)   TEST AUTHORIZATION     Status: None   Collection Time: 07/14/24 12:00 AM  Result Value Ref Range   TEST NAME: HCG W/GESTATIONAL TABLE HCG W    TEST CODE: [id number]    CLIENT CONTACT: LESLIE SMITH    REPORT ALWAYS MESSAGE SIGNATURE      Comment: . The laboratory testing on this patient was verbally requested or confirmed by the ordering physician or his or her authorized representative after contact with an employee of [employer]. Federal regulations require that we maintain on file written authorization for all laboratory testing.  Accordingly we are asking that the ordering physician or his or her authorized representative sign a copy of this report and promptly return it to the client service representative. . . Signature:____________________________________________________ . Please fax this signed page to [phone] or return it via your [employer] courier.   hCG, quantitative, pregnancy     Status: None   Collection Time: 07/14/24 12:00 AM  Result Value Ref Range   HCG, Total, QN 994 mIU/mL    Comment: Gestational Age   Expected hCG values (mIU/mL) <1 Week:                 5-50 1-2 Weeks:               50-500 2-3 Weeks:               [phone] 3-4 Weeks:               500-10000 4-5 Weeks:               1000-50000 5-6 Weeks:               10000-100000 6-8 Weeks:  15000-200000 2-3 Months:               10000-100000 The table above provides only a very rough estimate of gestational age and should be used only in conjunction with other methods for establishing gestational age. Much more reliable and accurate estimations of gestational age may be obtained by using LMP or ultrasound. . . Values from different assay methods may vary. The use of this assay to monitor or to diagnose  patients with cancer or any condition unrelated to pregnancy has not been cleared or approved by the FDA or the manufacturer of the assay.   Cytology - PAP     Status: None   Collection Time: 07/14/24 11:30 AM  Result Value Ref Range   Adequacy      Satisfactory but limited for evaluation with scant cellularity;   Adequacy transformation zone component present.    Diagnosis      - Negative for Intraepithelial Lesions or Malignancy (NILM)   Diagnosis - Benign reactive/reparative changes     Diabetic Foot Exam:     PHQ2/9:    07/28/2024   11:09 AM 07/14/2024   10:52 AM 06/30/2024   11:13 AM 12/03/2023    9:05 AM 07/12/2023    1:06 PM  Depression screen PHQ 2/9  Decreased Interest 0 0 0 0 0  Down, Depressed, Hopeless 0 0 0 0 0  PHQ - 2 Score 0 0 0 0 0  Altered sleeping    0 0  Tired, decreased energy    0 0  Change in appetite    0 0  Feeling bad or failure about yourself     0 0  Trouble concentrating    0 0  Moving slowly or fidgety/restless    0 0  Suicidal thoughts    0 0  PHQ-9 Score    0 0  Difficult doing work/chores    Not difficult at all     phq 9 is negative  Fall Risk:    07/28/2024   11:09 AM 07/14/2024   10:51 AM 06/30/2024   11:13 AM 12/03/2023    8:59 AM 07/12/2023    1:06 PM  Fall Risk   Falls in the past year? 0 0 0 0 0  Number falls in past yr: 0 0 0 0 0  Injury with Fall? 0 0 0 0 0  Risk for fall due to : No Fall Risks No Fall Risks No Fall Risks No Fall Risks No Fall Risks  Follow up Falls evaluation completed Falls evaluation completed Falls evaluation completed Falls  prevention discussed;Education provided;Falls evaluation completed Falls prevention discussed     Assessment & Plan Left lower quadrant pain Intermittent achy pain since August 1st, initially severe, now mild. Differential includes scar tissue or constipation. Gradual improvement noted. Recent medication-induced abortion with mild bleeding, expected up to six weeks. - Perform pelvic exam. - Order quantitative HCG. - Consider pelvic ultrasound if labs normal and pain persists.

## 2024-07-29 ENCOUNTER — Ambulatory Visit: Payer: Self-pay | Admitting: Family Medicine

## 2024-08-07 ENCOUNTER — Other Ambulatory Visit: Payer: Self-pay

## 2024-08-07 DIAGNOSIS — Z021 Encounter for pre-employment examination: Secondary | ICD-10-CM

## 2024-08-07 NOTE — Progress Notes (Signed)
 Completed and cleared pre employment drug screen per [employer] protocol after consent signed.

## 2024-11-21 ENCOUNTER — Encounter: Payer: Self-pay | Admitting: Family Medicine

## 2024-11-24 ENCOUNTER — Other Ambulatory Visit (HOSPITAL_COMMUNITY)
Admission: RE | Admit: 2024-11-24 | Discharge: 2024-11-24 | Disposition: A | Source: Ambulatory Visit | Attending: Nurse Practitioner | Admitting: Nurse Practitioner

## 2024-11-24 ENCOUNTER — Ambulatory Visit (INDEPENDENT_AMBULATORY_CARE_PROVIDER_SITE_OTHER): Admitting: Nurse Practitioner

## 2024-11-24 VITALS — BP 130/86 | HR 74 | Temp 97.6°F | Ht 63.5 in | Wt 173.0 lb

## 2024-11-24 DIAGNOSIS — Z23 Encounter for immunization: Secondary | ICD-10-CM

## 2024-11-24 DIAGNOSIS — N898 Other specified noninflammatory disorders of vagina: Secondary | ICD-10-CM | POA: Diagnosis present

## 2024-11-24 MED ORDER — FLUCONAZOLE 150 MG PO TABS: 150.0000 mg | ORAL_TABLET | ORAL | 0 refills | Status: AC | PRN

## 2024-11-24 NOTE — Progress Notes (Signed)
 "  BP 130/86   Pulse 74   Temp 97.6 F (36.4 C)   Ht 5' 3.5 (1.613 m)   Wt 173 lb (78.5 kg)   LMP 11/22/2024   SpO2 99%   BMI 30.16 kg/m    Subjective:    Patient ID: Stacy Stewart, female    DOB: 08/08/2001, 23 y.o.   MRN: [id number]  HPI: Stacy Stewart is a 23 y.o. female  Chief Complaint  Patient presents with   Vaginal Discharge    Pt c/o vaginal discharge x1 week. Denies pain. Last sexual encounter in October.    Discussed the use of AI scribe software for clinical note transcription with the patient, who gave verbal consent to proceed.  History of Present Illness Stacy Stewart is a 23 year old female who presents with vaginal discharge for one week.  Vaginal discharge - White and clumpy discharge present for one week - Discharge was most noticeable on Sunday - No associated pain - No recent antibiotic use, though antibiotics have been used in the past  Gynecologic history - Last sexual encounter in October - Last menstrual period began on December 24th - Denies possibility of pregnancy         07/28/2024   11:09 AM 07/14/2024   10:52 AM 06/30/2024   11:13 AM  Depression screen PHQ 2/9  Decreased Interest 0 0 0  Down, Depressed, Hopeless 0 0 0  PHQ - 2 Score 0 0 0    Relevant past medical, surgical, family and social history reviewed and updated as indicated. Interim medical history since our last visit reviewed. Allergies and medications reviewed and updated.  Review of Systems  Ten systems reviewed and is negative except as mentioned in HPI      Objective:      BP 130/86   Pulse 74   Temp 97.6 F (36.4 C)   Ht 5' 3.5 (1.613 m)   Wt 173 lb (78.5 kg)   LMP 11/22/2024   SpO2 99%   BMI 30.16 kg/m    Wt Readings from Last 3 Encounters:  11/24/24 173 lb (78.5 kg)  07/28/24 161 lb 12.8 oz (73.4 kg)  07/14/24 162 lb 3.2 oz (73.6 kg)    Physical Exam GENERAL: Alert, cooperative, well developed, no acute distress HEENT: Normocephalic, normal  oropharynx, moist mucous membranes CHEST: Clear to auscultation bilaterally, No wheezes, rhonchi, or crackles CARDIOVASCULAR: Normal heart rate and rhythm, S1 and S2 normal without murmurs ABDOMEN: Soft, non-tender, non-distended, without organomegaly, Normal bowel sounds EXTREMITIES: No cyanosis or edema NEUROLOGICAL: Cranial nerves grossly intact, Moves all extremities without gross motor or sensory deficit  Results for orders placed or performed in visit on 07/28/24  CBC with Differential/Platelet   Collection Time: 07/28/24 12:12 PM  Result Value Ref Range   WBC 7.3 3.8 - 10.8 Thousand/uL   RBC 4.13 3.80 - 5.10 Million/uL   Hemoglobin 9.9 (L) 11.7 - 15.5 g/dL   HCT 68.3 (L) 64.9 - 54.9 %   MCV 76.5 (L) 80.0 - 100.0 fL   MCH 24.0 (L) 27.0 - 33.0 pg   MCHC 31.3 (L) 32.0 - 36.0 g/dL   RDW 85.1 88.9 - 84.9 %   Platelets 484 (H) 140 - 400 Thousand/uL   MPV 10.4 7.5 - 12.5 fL   Neutro Abs 5,322 1,500 - 7,800 cells/uL   Absolute Lymphocytes 1,431 850 - 3,900 cells/uL   Absolute Monocytes 504 200 - 950 cells/uL   Eosinophils Absolute 22 15 -  500 cells/uL   Basophils Absolute 22 0 - 200 cells/uL   Neutrophils Relative % 72.9 %   Total Lymphocyte 19.6 %   Monocytes Relative 6.9 %   Eosinophils Relative 0.3 %   Basophils Relative 0.3 %  Comprehensive Metabolic Panel (CMET)   Collection Time: 07/28/24 12:12 PM  Result Value Ref Range   Glucose, Bld 87 65 - 99 mg/dL   BUN 12 7 - 25 mg/dL   Creat 9.29 9.49 - 9.03 mg/dL   eGFR 873 > OR = 60 fO/fpw/8.26f7   BUN/Creatinine Ratio SEE NOTE: 6 - 22 (calc)   Sodium 137 135 - 146 mmol/L   Potassium 4.7 3.5 - 5.3 mmol/L   Chloride 105 98 - 110 mmol/L   CO2 24 20 - 32 mmol/L   Calcium 9.1 8.6 - 10.2 mg/dL   Total Protein 6.9 6.1 - 8.1 g/dL   Albumin 4.1 3.6 - 5.1 g/dL   Globulin 2.8 1.9 - 3.7 g/dL (calc)   AG Ratio 1.5 1.0 - 2.5 (calc)   Total Bilirubin 0.3 0.2 - 1.2 mg/dL   Alkaline phosphatase (APISO) 41 31 - 125 U/L   AST 12 10 - 30  U/L   ALT 9 6 - 29 U/L  hCG, quantitative, pregnancy   Collection Time: 07/28/24 12:12 PM  Result Value Ref Range   HCG, Total, QN 111 mIU/mL          Assessment & Plan:   Problem List Items Addressed This Visit   None Visit Diagnoses       Vaginal discharge    -  Primary   Relevant Medications   fluconazole  (DIFLUCAN ) 150 MG tablet   Other Relevant Orders   Cervicovaginal ancillary only     Immunization due            Assessment and Plan Assessment & Plan Acute vaginitis (presumed candidiasis) White, creamy vaginal discharge for one week without pain. Last sexual encounter in October and last menstrual period on December 24th. Differential diagnosis includes yeast infection and bacterial vaginosis. Awaiting vaginal swab results for confirmation. - Prescribed fluconazole , one pill every three days as needed for symptoms. - Advised to save the second pill if symptoms resolve after the first dose. - Instructed to take the second pill if symptoms persist after three days. - Will notify via MyChart once swab results are available.        Follow up plan: Return if symptoms worsen or fail to improve. "

## 2024-11-28 LAB — CERVICOVAGINAL ANCILLARY ONLY
Bacterial Vaginitis (gardnerella): NEGATIVE
Candida Glabrata: NEGATIVE
Candida Vaginitis: POSITIVE — AB
Chlamydia: NEGATIVE
Comment: NEGATIVE
Comment: NEGATIVE
Comment: NEGATIVE
Comment: NEGATIVE
Comment: NEGATIVE
Comment: NORMAL
Neisseria Gonorrhea: NEGATIVE
Trichomonas: NEGATIVE

## 2024-11-29 ENCOUNTER — Ambulatory Visit: Payer: Self-pay | Admitting: Nurse Practitioner

## 2024-12-26 ENCOUNTER — Telehealth: Admitting: Physician Assistant

## 2024-12-26 DIAGNOSIS — R3989 Other symptoms and signs involving the genitourinary system: Secondary | ICD-10-CM

## 2024-12-26 MED ORDER — NITROFURANTOIN MONOHYD MACRO 100 MG PO CAPS: 100.0000 mg | ORAL_CAPSULE | Freq: Two times a day (BID) | ORAL | 0 refills | Status: AC

## 2024-12-26 NOTE — Progress Notes (Signed)

## 2025-07-17 ENCOUNTER — Encounter: Admitting: Family Medicine
